# Patient Record
Sex: Female | Born: 1955 | Race: White | Hispanic: No | Marital: Married | Smoking: Former smoker
Health system: Southern US, Community
[De-identification: ages and names within clinical notes are randomized; demographics above are authoritative.]

## PROBLEM LIST (undated history)

## (undated) DIAGNOSIS — K219 Gastro-esophageal reflux disease without esophagitis: Secondary | ICD-10-CM

## (undated) DIAGNOSIS — Z780 Asymptomatic menopausal state: Secondary | ICD-10-CM

## (undated) DIAGNOSIS — E785 Hyperlipidemia, unspecified: Secondary | ICD-10-CM

## (undated) DIAGNOSIS — J449 Chronic obstructive pulmonary disease, unspecified: Secondary | ICD-10-CM

## (undated) DIAGNOSIS — R430 Anosmia: Secondary | ICD-10-CM

## (undated) DIAGNOSIS — F419 Anxiety disorder, unspecified: Secondary | ICD-10-CM

## (undated) DIAGNOSIS — R011 Cardiac murmur, unspecified: Secondary | ICD-10-CM

## (undated) DIAGNOSIS — M1990 Unspecified osteoarthritis, unspecified site: Secondary | ICD-10-CM

## (undated) DIAGNOSIS — J439 Emphysema, unspecified: Secondary | ICD-10-CM

## (undated) DIAGNOSIS — I509 Heart failure, unspecified: Secondary | ICD-10-CM

## (undated) DIAGNOSIS — M810 Age-related osteoporosis without current pathological fracture: Secondary | ICD-10-CM

## (undated) DIAGNOSIS — I4891 Unspecified atrial fibrillation: Secondary | ICD-10-CM

## (undated) DIAGNOSIS — I050 Rheumatic mitral stenosis: Secondary | ICD-10-CM

## (undated) HISTORY — PX: TUBAL LIGATION: SHX77

## (undated) HISTORY — DX: Hyperlipidemia, unspecified: E78.5

## (undated) HISTORY — PX: CARDIAC VALVE REPLACEMENT: SHX585

## (undated) HISTORY — DX: Gastro-esophageal reflux disease without esophagitis: K21.9

## (undated) HISTORY — DX: Anxiety disorder, unspecified: F41.9

## (undated) HISTORY — DX: Chronic obstructive pulmonary disease, unspecified: J44.9

## (undated) HISTORY — DX: Anosmia: R43.0

## (undated) HISTORY — DX: Unspecified atrial fibrillation: I48.91

## (undated) HISTORY — DX: Age-related osteoporosis without current pathological fracture: M81.0

## (undated) HISTORY — DX: Cardiac murmur, unspecified: R01.1

## (undated) HISTORY — DX: Heart failure, unspecified: I50.9

## (undated) HISTORY — DX: Unspecified osteoarthritis, unspecified site: M19.90

## (undated) HISTORY — DX: Emphysema, unspecified: J43.9

## (undated) HISTORY — DX: Asymptomatic menopausal state: Z78.0

## (undated) HISTORY — DX: Rheumatic mitral stenosis: I05.0

---

## 1990-02-19 HISTORY — PX: OTHER SURGICAL HISTORY: SHX169

## 2004-02-20 HISTORY — PX: OTHER SURGICAL HISTORY: SHX169

## 2005-01-19 HISTORY — PX: MITRAL VALVULOPLASTY: SHX143

## 2006-02-19 HISTORY — PX: OTHER SURGICAL HISTORY: SHX169

## 2006-08-20 ENCOUNTER — Encounter: Admission: RE | Admit: 2006-08-20 | Discharge: 2006-08-20 | Payer: Self-pay | Admitting: Obstetrics and Gynecology

## 2006-09-22 ENCOUNTER — Encounter: Payer: Self-pay | Admitting: Family Medicine

## 2006-09-22 LAB — CONVERTED CEMR LAB
HDL: 74 mg/dL
LDL Cholesterol: 127 mg/dL

## 2007-02-20 DIAGNOSIS — Z780 Asymptomatic menopausal state: Secondary | ICD-10-CM

## 2007-02-20 HISTORY — DX: Asymptomatic menopausal state: Z78.0

## 2007-08-05 ENCOUNTER — Ambulatory Visit: Payer: Self-pay | Admitting: Family Medicine

## 2007-08-05 DIAGNOSIS — I4891 Unspecified atrial fibrillation: Secondary | ICD-10-CM

## 2007-08-19 ENCOUNTER — Ambulatory Visit: Payer: Self-pay | Admitting: Family Medicine

## 2007-08-19 LAB — CONVERTED CEMR LAB
INR: 1.6
Prothrombin Time: 15.5 s

## 2007-08-25 ENCOUNTER — Encounter: Admission: RE | Admit: 2007-08-25 | Discharge: 2007-08-25 | Payer: Self-pay | Admitting: Family Medicine

## 2007-08-28 ENCOUNTER — Encounter: Payer: Self-pay | Admitting: Family Medicine

## 2007-09-02 ENCOUNTER — Ambulatory Visit: Payer: Self-pay | Admitting: Family Medicine

## 2007-09-02 DIAGNOSIS — R439 Unspecified disturbances of smell and taste: Secondary | ICD-10-CM

## 2007-09-29 ENCOUNTER — Ambulatory Visit: Payer: Self-pay | Admitting: Family Medicine

## 2007-09-29 LAB — CONVERTED CEMR LAB: INR: 1.4

## 2007-10-07 ENCOUNTER — Ambulatory Visit: Payer: Self-pay | Admitting: Family Medicine

## 2007-10-21 ENCOUNTER — Ambulatory Visit: Payer: Self-pay | Admitting: Family Medicine

## 2007-10-21 LAB — CONVERTED CEMR LAB: INR: 2

## 2007-11-04 ENCOUNTER — Other Ambulatory Visit: Admission: RE | Admit: 2007-11-04 | Discharge: 2007-11-04 | Payer: Self-pay | Admitting: Family Medicine

## 2007-11-04 ENCOUNTER — Encounter: Payer: Self-pay | Admitting: Family Medicine

## 2007-11-04 ENCOUNTER — Ambulatory Visit: Payer: Self-pay | Admitting: Family Medicine

## 2007-11-04 LAB — CONVERTED CEMR LAB: Prothrombin Time: 17.5 s

## 2007-11-05 LAB — CONVERTED CEMR LAB
AST: 20 units/L (ref 0–37)
Albumin: 4.4 g/dL (ref 3.5–5.2)
Alkaline Phosphatase: 52 units/L (ref 39–117)
Cholesterol, target level: 200 mg/dL
HDL goal, serum: 40 mg/dL
LDL Cholesterol: 139 mg/dL — ABNORMAL HIGH (ref 0–99)
LDL Goal: 160 mg/dL
Potassium: 4.3 meq/L (ref 3.5–5.3)
Sodium: 138 meq/L (ref 135–145)
TSH: 1.093 microintl units/mL (ref 0.350–4.50)
Total Protein: 7.9 g/dL (ref 6.0–8.3)

## 2007-11-12 ENCOUNTER — Encounter: Payer: Self-pay | Admitting: Family Medicine

## 2007-11-18 ENCOUNTER — Ambulatory Visit: Payer: Self-pay | Admitting: Family Medicine

## 2007-12-10 ENCOUNTER — Ambulatory Visit: Payer: Self-pay | Admitting: Family Medicine

## 2008-01-07 ENCOUNTER — Ambulatory Visit: Payer: Self-pay | Admitting: Family Medicine

## 2008-02-04 ENCOUNTER — Ambulatory Visit: Payer: Self-pay | Admitting: Family Medicine

## 2008-02-04 LAB — CONVERTED CEMR LAB
INR: 2
Prothrombin Time: 17.3 s

## 2008-03-03 ENCOUNTER — Ambulatory Visit: Payer: Self-pay | Admitting: Family Medicine

## 2008-03-03 LAB — CONVERTED CEMR LAB: Prothrombin Time: 16.4 s

## 2008-03-10 ENCOUNTER — Ambulatory Visit: Payer: Self-pay | Admitting: Cardiology

## 2008-03-22 ENCOUNTER — Encounter: Payer: Self-pay | Admitting: Cardiology

## 2008-03-22 ENCOUNTER — Ambulatory Visit: Payer: Self-pay

## 2008-03-24 ENCOUNTER — Ambulatory Visit: Payer: Self-pay | Admitting: Family Medicine

## 2008-03-24 LAB — CONVERTED CEMR LAB: INR: 2.2

## 2008-04-21 ENCOUNTER — Encounter: Payer: Self-pay | Admitting: Family Medicine

## 2008-04-21 LAB — CONVERTED CEMR LAB: INR: 2.4

## 2008-05-09 ENCOUNTER — Ambulatory Visit: Payer: Self-pay | Admitting: Family Medicine

## 2008-05-09 DIAGNOSIS — J309 Allergic rhinitis, unspecified: Secondary | ICD-10-CM | POA: Insufficient documentation

## 2008-05-10 ENCOUNTER — Telehealth: Payer: Self-pay | Admitting: Family Medicine

## 2008-05-24 ENCOUNTER — Encounter: Payer: Self-pay | Admitting: Family Medicine

## 2008-05-24 LAB — CONVERTED CEMR LAB
INR: 2.5
INR: 2.5 — ABNORMAL HIGH (ref 0.0–1.5)

## 2008-06-21 ENCOUNTER — Encounter: Payer: Self-pay | Admitting: Family Medicine

## 2008-06-21 LAB — CONVERTED CEMR LAB
INR: 2.9
INR: 2.9 — ABNORMAL HIGH (ref 0.0–1.5)

## 2008-07-20 ENCOUNTER — Encounter: Payer: Self-pay | Admitting: Family Medicine

## 2008-08-11 ENCOUNTER — Encounter (INDEPENDENT_AMBULATORY_CARE_PROVIDER_SITE_OTHER): Payer: Self-pay | Admitting: *Deleted

## 2008-08-17 ENCOUNTER — Encounter: Payer: Self-pay | Admitting: Family Medicine

## 2008-08-17 LAB — CONVERTED CEMR LAB
INR: 1.7
INR: 1.7 — ABNORMAL HIGH (ref 0.0–1.5)
Prothrombin Time: 16.5 s
Prothrombin Time: 16.5 s — ABNORMAL HIGH (ref 10.2–14.0)

## 2008-08-21 DIAGNOSIS — I050 Rheumatic mitral stenosis: Secondary | ICD-10-CM | POA: Insufficient documentation

## 2008-08-21 DIAGNOSIS — E785 Hyperlipidemia, unspecified: Secondary | ICD-10-CM | POA: Insufficient documentation

## 2008-09-01 ENCOUNTER — Ambulatory Visit: Payer: Self-pay | Admitting: Cardiology

## 2008-09-01 ENCOUNTER — Encounter: Payer: Self-pay | Admitting: Cardiology

## 2008-09-07 ENCOUNTER — Encounter: Admission: RE | Admit: 2008-09-07 | Discharge: 2008-09-07 | Payer: Self-pay | Admitting: Family Medicine

## 2008-09-14 ENCOUNTER — Encounter: Payer: Self-pay | Admitting: Family Medicine

## 2008-09-14 ENCOUNTER — Telehealth (INDEPENDENT_AMBULATORY_CARE_PROVIDER_SITE_OTHER): Payer: Self-pay | Admitting: *Deleted

## 2008-09-14 LAB — CONVERTED CEMR LAB
Prothrombin Time: 22.8 s
Prothrombin Time: 22.8 s — ABNORMAL HIGH (ref 10.2–14.0)

## 2008-10-04 ENCOUNTER — Ambulatory Visit: Payer: Self-pay | Admitting: Family Medicine

## 2008-10-04 LAB — CONVERTED CEMR LAB
Nitrite: NEGATIVE
Protein, U semiquant: NEGATIVE
Specific Gravity, Urine: 1.01
Urobilinogen, UA: 0.2

## 2008-10-15 ENCOUNTER — Encounter: Payer: Self-pay | Admitting: Family Medicine

## 2008-10-15 LAB — CONVERTED CEMR LAB
INR: 2.3
Prothrombin Time: 20.3 s

## 2008-11-15 ENCOUNTER — Encounter: Payer: Self-pay | Admitting: Family Medicine

## 2008-11-15 LAB — CONVERTED CEMR LAB
INR: 2.8
INR: 2.8 — ABNORMAL HIGH (ref 0.0–1.5)
Prothrombin Time: 22.5 s — ABNORMAL HIGH (ref 10.2–14.0)

## 2008-12-06 ENCOUNTER — Ambulatory Visit: Payer: Self-pay | Admitting: Family Medicine

## 2008-12-06 DIAGNOSIS — Z9889 Other specified postprocedural states: Secondary | ICD-10-CM | POA: Insufficient documentation

## 2008-12-13 ENCOUNTER — Encounter: Payer: Self-pay | Admitting: Family Medicine

## 2008-12-14 LAB — CONVERTED CEMR LAB
ALT: 15 units/L (ref 0–35)
AST: 20 units/L (ref 0–37)
BUN: 13 mg/dL (ref 6–23)
Creatinine, Ser: 0.63 mg/dL (ref 0.40–1.20)
HDL: 59 mg/dL (ref >39)
INR: 2.64
INR: 2.64 — ABNORMAL HIGH (ref <1.50)
MCHC: 33.5 g/dL (ref 30.0–36.0)
Platelets: 258 10*3/uL (ref 150–400)
Prothrombin Time: 28 s
Prothrombin Time: 28 s — ABNORMAL HIGH (ref 11.6–15.2)
RDW: 14.4 % (ref 11.5–15.5)
Total Bilirubin: 0.4 mg/dL (ref 0.3–1.2)
Total CHOL/HDL Ratio: 3.7
VLDL: 16 mg/dL (ref 0–40)

## 2008-12-16 ENCOUNTER — Telehealth: Payer: Self-pay | Admitting: Cardiology

## 2009-01-10 ENCOUNTER — Encounter: Payer: Self-pay | Admitting: Family Medicine

## 2009-01-10 LAB — CONVERTED CEMR LAB: Prothrombin Time: 22.6 s

## 2009-01-27 ENCOUNTER — Telehealth (INDEPENDENT_AMBULATORY_CARE_PROVIDER_SITE_OTHER): Payer: Self-pay | Admitting: *Deleted

## 2009-02-07 ENCOUNTER — Encounter: Payer: Self-pay | Admitting: Family Medicine

## 2009-02-08 LAB — CONVERTED CEMR LAB
INR: 2.93
INR: 2.93 — ABNORMAL HIGH (ref <1.50)
Prothrombin Time: 23.4 s

## 2009-02-17 ENCOUNTER — Telehealth: Payer: Self-pay | Admitting: Family Medicine

## 2009-02-17 ENCOUNTER — Encounter (INDEPENDENT_AMBULATORY_CARE_PROVIDER_SITE_OTHER): Payer: Self-pay | Admitting: *Deleted

## 2009-02-17 DIAGNOSIS — R040 Epistaxis: Secondary | ICD-10-CM

## 2009-02-25 ENCOUNTER — Ambulatory Visit: Payer: Self-pay | Admitting: Family Medicine

## 2009-02-25 DIAGNOSIS — S9000XA Contusion of unspecified ankle, initial encounter: Secondary | ICD-10-CM | POA: Insufficient documentation

## 2009-02-25 LAB — CONVERTED CEMR LAB: INR: 3.6

## 2009-02-28 ENCOUNTER — Telehealth: Payer: Self-pay | Admitting: Family Medicine

## 2009-02-28 ENCOUNTER — Encounter: Payer: Self-pay | Admitting: Family Medicine

## 2009-02-28 DIAGNOSIS — I808 Phlebitis and thrombophlebitis of other sites: Secondary | ICD-10-CM | POA: Insufficient documentation

## 2009-03-07 ENCOUNTER — Telehealth: Payer: Self-pay | Admitting: Family Medicine

## 2009-03-11 ENCOUNTER — Encounter: Payer: Self-pay | Admitting: Family Medicine

## 2009-03-11 LAB — CONVERTED CEMR LAB
Prothrombin Time: 19.3 s
Prothrombin Time: 19.3 s — ABNORMAL HIGH (ref 10.2–14.0)

## 2009-03-14 LAB — CONVERTED CEMR LAB
AST: 19 units/L (ref 0–37)
Direct LDL: 85 mg/dL

## 2009-04-01 ENCOUNTER — Telehealth: Payer: Self-pay | Admitting: Cardiology

## 2009-04-11 ENCOUNTER — Encounter: Payer: Self-pay | Admitting: Family Medicine

## 2009-04-11 LAB — CONVERTED CEMR LAB
INR: 2.07 — ABNORMAL HIGH (ref <1.50)
Prothrombin Time: 18.9 s — ABNORMAL HIGH (ref 10.2–14.0)

## 2009-04-18 ENCOUNTER — Telehealth: Payer: Self-pay | Admitting: Family Medicine

## 2009-05-09 ENCOUNTER — Encounter: Payer: Self-pay | Admitting: Family Medicine

## 2009-05-09 LAB — CONVERTED CEMR LAB
Prothrombin Time: 20.2 s
Prothrombin Time: 20.2 s — ABNORMAL HIGH (ref 10.2–14.0)

## 2009-05-16 ENCOUNTER — Ambulatory Visit: Payer: Self-pay | Admitting: Family Medicine

## 2009-06-06 ENCOUNTER — Encounter: Payer: Self-pay | Admitting: Family Medicine

## 2009-06-06 LAB — CONVERTED CEMR LAB
INR: 2.01
INR: 2.01 — ABNORMAL HIGH (ref <1.50)
Prothrombin Time: 18.6 s
Prothrombin Time: 18.6 s — ABNORMAL HIGH (ref 10.2–14.0)

## 2009-06-07 ENCOUNTER — Encounter: Payer: Self-pay | Admitting: Family Medicine

## 2009-06-29 ENCOUNTER — Ambulatory Visit: Payer: Self-pay | Admitting: Cardiology

## 2009-06-29 ENCOUNTER — Encounter: Payer: Self-pay | Admitting: Cardiology

## 2009-07-04 ENCOUNTER — Encounter: Payer: Self-pay | Admitting: Family Medicine

## 2009-07-04 LAB — CONVERTED CEMR LAB
Prothrombin Time: 17.9 s
Prothrombin Time: 17.9 s — ABNORMAL HIGH (ref 10.2–14.0)

## 2009-08-01 ENCOUNTER — Ambulatory Visit (HOSPITAL_COMMUNITY): Admission: RE | Admit: 2009-08-01 | Discharge: 2009-08-01 | Payer: Self-pay | Admitting: Cardiology

## 2009-08-01 ENCOUNTER — Ambulatory Visit: Payer: Self-pay | Admitting: Cardiovascular Disease

## 2009-08-01 ENCOUNTER — Encounter: Payer: Self-pay | Admitting: Family Medicine

## 2009-08-01 ENCOUNTER — Ambulatory Visit: Payer: Self-pay

## 2009-08-01 ENCOUNTER — Encounter: Payer: Self-pay | Admitting: Cardiology

## 2009-08-29 ENCOUNTER — Encounter: Payer: Self-pay | Admitting: Family Medicine

## 2009-08-29 LAB — CONVERTED CEMR LAB
INR: 2.05
INR: 2.05 — ABNORMAL HIGH (ref <1.50)
Prothrombin Time: 22.7 s — ABNORMAL HIGH (ref 11.6–15.2)

## 2009-09-26 ENCOUNTER — Encounter: Payer: Self-pay | Admitting: Family Medicine

## 2009-09-26 LAB — CONVERTED CEMR LAB
INR: 2.28
INR: 2.28 — ABNORMAL HIGH (ref <1.50)
Prothrombin Time: 24.7 s — ABNORMAL HIGH (ref 11.6–15.2)

## 2009-09-27 ENCOUNTER — Encounter: Admission: RE | Admit: 2009-09-27 | Discharge: 2009-09-27 | Payer: Self-pay | Admitting: Family Medicine

## 2009-10-31 ENCOUNTER — Telehealth (INDEPENDENT_AMBULATORY_CARE_PROVIDER_SITE_OTHER): Payer: Self-pay | Admitting: *Deleted

## 2009-10-31 ENCOUNTER — Encounter: Payer: Self-pay | Admitting: Family Medicine

## 2009-10-31 ENCOUNTER — Encounter: Payer: Self-pay | Admitting: *Deleted

## 2009-11-28 ENCOUNTER — Encounter: Payer: Self-pay | Admitting: Family Medicine

## 2009-11-28 LAB — CONVERTED CEMR LAB: Prothrombin Time: 23 s — ABNORMAL HIGH (ref 11.6–15.2)

## 2009-12-05 ENCOUNTER — Ambulatory Visit: Payer: Self-pay | Admitting: Family Medicine

## 2009-12-22 ENCOUNTER — Telehealth: Payer: Self-pay | Admitting: Family Medicine

## 2009-12-22 DIAGNOSIS — E559 Vitamin D deficiency, unspecified: Secondary | ICD-10-CM | POA: Insufficient documentation

## 2009-12-26 ENCOUNTER — Ambulatory Visit: Payer: Self-pay | Admitting: Family Medicine

## 2009-12-26 ENCOUNTER — Other Ambulatory Visit: Admission: RE | Admit: 2009-12-26 | Discharge: 2009-12-26 | Payer: Self-pay | Admitting: Family Medicine

## 2009-12-26 DIAGNOSIS — Z780 Asymptomatic menopausal state: Secondary | ICD-10-CM | POA: Insufficient documentation

## 2009-12-26 LAB — CONVERTED CEMR LAB
INR: 2.47
INR: 2.47 — ABNORMAL HIGH (ref <1.50)
Prothrombin Time: 26.2 s — ABNORMAL HIGH (ref 11.6–15.2)

## 2009-12-27 LAB — CONVERTED CEMR LAB
Albumin: 4.6 g/dL (ref 3.5–5.2)
Alkaline Phosphatase: 50 units/L (ref 39–117)
BUN: 15 mg/dL (ref 6–23)
Calcium: 9 mg/dL (ref 8.4–10.5)
Chloride: 104 meq/L (ref 96–112)
Cholesterol: 182 mg/dL (ref 0–200)
Glucose, Bld: 96 mg/dL (ref 70–99)
HDL: 66 mg/dL (ref >39)
Hemoglobin: 13.1 g/dL (ref 12.0–15.0)
MCHC: 33.2 g/dL (ref 30.0–36.0)
Potassium: 4.3 meq/L (ref 3.5–5.3)
RBC: 4.77 M/uL (ref 3.87–5.11)
Triglycerides: 42 mg/dL (ref <150)

## 2010-01-03 ENCOUNTER — Encounter: Admission: RE | Admit: 2010-01-03 | Discharge: 2010-01-03 | Payer: Self-pay | Admitting: Family Medicine

## 2010-01-07 DIAGNOSIS — M899 Disorder of bone, unspecified: Secondary | ICD-10-CM | POA: Insufficient documentation

## 2010-01-07 DIAGNOSIS — M949 Disorder of cartilage, unspecified: Secondary | ICD-10-CM

## 2010-01-23 ENCOUNTER — Ambulatory Visit: Payer: Self-pay | Admitting: Family Medicine

## 2010-01-23 DIAGNOSIS — M79609 Pain in unspecified limb: Secondary | ICD-10-CM

## 2010-01-23 LAB — CONVERTED CEMR LAB
INR: 2.29 — ABNORMAL HIGH (ref <1.50)
Prothrombin Time: 24.8 s — ABNORMAL HIGH (ref 11.6–15.2)

## 2010-02-21 ENCOUNTER — Encounter: Payer: Self-pay | Admitting: Family Medicine

## 2010-02-21 LAB — CONVERTED CEMR LAB
INR: 2.08 — ABNORMAL HIGH (ref <1.50)
Prothrombin Time: 23 s — ABNORMAL HIGH (ref 11.6–15.2)

## 2010-03-21 NOTE — Assessment & Plan Note (Signed)
Summary: Rt foot problem-jr   Vital Signs:  Patient profile:   55 year old female Height:      65 inches Weight:      139 pounds BMI:     23.21 O2 Sat:      99 % on Room air Temp:     99.0 degrees F oral Pulse rate:   83 / minute BP sitting:   113 / 68  (left arm) Cuff size:   regular  Vitals Entered By: Payton Spark CMA (January 23, 2010 3:58 PM)  O2 Flow:  Room air CC: Sore spot on bottom of R foot x 1 month   Primary Care Provider:  Seymour Bars DO  CC:  Sore spot on bottom of R foot x 1 month.  History of Present Illness: 55 yo WF presents for a nodule on the plantar surface of her R foot that she noticed a month ago.  it is not painful to bear weight or ambulate but it does hurt to push on it.  Denies trauma or overuse.  She is able to dorsiflex her big toe w/o pain.  Denies redness.  It is sitting in the medial arch of her foot.    Allergies: No Known Drug Allergies  Past History:  Past Medical History: Reviewed history from 06/29/2009 and no changes required. ATRIAL FIBRILLATION (ICD-427.31) HYPERLIPIDEMIA (ICD-272.4) MITRAL STENOSIS (ICD-394.0) ALLERGIC RHINITIS CAUSE UNSPECIFIED (ICD-477.9) ANOSMIA (ICD-781.1) COUMADIN THERAPY (ICD-V58.61) G1P1 menopause 2009  Past Surgical History: Reviewed history from 06/29/2009 and no changes required. dental implant 2008 Mitral valvuloplasty  12-06 at [facility] BTL 1982  tubal ligation. cath - no CAD 2006  Social History: Reviewed history from 12/06/2008 and no changes required. Doing warehouse work for a store. Married to [place]. Has a grown son close by. Quit smoking 1988. Denies ETOH. Walks some for exercise.  Review of Systems      See HPI  Physical Exam  General:  alert, well-developed, well-nourished, and well-hydrated.   Extremities:  pea - sized R foot medial 1st digit flexor tendon nodule, hard and slightly tender.  full dorsi/plantar flexion of the R big toe w/o pain.  gait normal.   Neurologic:   gait normal.   Skin:  no skin redness or bruising over medial R foot   Impression & Recommendations:  Problem # 1:  FOOT PAIN (ICD-729.5) Localized tenderness only to palpation of a flexor tendon nodule over R 1st metatarsal.  Since it is in the medial arch, it is not bearing weight and is not causing any pain.  Advised pt to just watch this for increasing size and pain.    Complete Medication List: 1)  Metoprolol Succinate 50 Mg Xr24h-tab (Metoprolol succinate) .... Take one tablet by  mouth daily 2)  Pravastatin Sodium 40 Mg Tabs (Pravastatin sodium) .Marland Kitchen.. 1 tab by mouth qhs 3)  Calcium Carbonate-vitamin D 600-400 Mg-unit Tabs (Calcium carbonate-vitamin d) .... Take 1 tablet by mouth once a day 4)  Coumadin 4 Mg Tabs (Warfarin sodium) .... Sunday - 6 mg, monday - 6 mg, tuesday - 4 mg, wednesday - 6 mg, thursday - 6 mg, friday - 4 mg, saturday - 6 mg 5)  Coumadin 1 Mg Tabs (Warfarin sodium) .... Sunday - 6 mg, monday - 6 mg, tuesday - 4 mg, wednesday - 6 mg, thursday - 6 mg, friday - 4 mg, saturday - 6 mg  Patient Instructions: 1)  No treatment needed for flexor tendon nodule in foot. 2)  Call  if it starts to hurt with weight bearing or walking.   Orders Added: 1)  Est. Patient Level II [24401]

## 2010-03-21 NOTE — Assessment & Plan Note (Signed)
Summary: Sonya Fields   Visit Type:  9 months follow up Primary Provider:  Seymour Bars DO  CC:  Chest pains- Tiredness.  History of Present Illness: Sonya Fields is a pleasant female, who has a history of mitral stenosis and atrial fibrillation.  The patient does have a history of rheumatic fever, but she does not recall this.  In 2006, she was sent to [facility] and underwent balloon mitral valvuloplasty.  She also  has a history of paroxysmal atrial fibrillation diagnosed in April 2009.  Last echocardiogram was performed on March 22, 2008. This showed normal LV function, mild to moderate left atrial enlargement, mild mitral stenosis with a valve area of 1.5 cm, and mild mitral regurgitation. I last saw her in July 2010. Since then she has dyspnea with more extreme activities but not with routine activities. It is relieved with rest. There is no orthopnea, PND, pedal edema or syncope. She has had one bout of atypical fibrillation since I saw her previously. It lasted for 2 hours and resolved after taking an extra Toprol. She has not had bleeding.  Current Medications (verified): 1)  Metoprolol Succinate 50 Mg Xr24h-Tab (Metoprolol Succinate) .... Take One Tablet By  Mouth Daily 2)  Pravastatin Sodium 20 Mg Tabs (Pravastatin Sodium) .Marland Kitchen.. 1 Tab By Mouth Qhs 3)  Warfarin Sodium 6 Mg Tabs (Warfarin Sodium) .... Take 1 Tab By Mouth Once Daily As Directed. 4)  Calcium Carbonate-Vitamin D 600-400 Mg-Unit  Tabs (Calcium Carbonate-Vitamin D) .... Take 1 Tablet By Mouth Once A Day  Allergies (verified): No Known Drug Allergies  Past History:  Past Medical History: ATRIAL FIBRILLATION (ICD-427.31) HYPERLIPIDEMIA (ICD-272.4) MITRAL STENOSIS (ICD-394.0) ALLERGIC RHINITIS CAUSE UNSPECIFIED (ICD-477.9) ANOSMIA (ICD-781.1) COUMADIN THERAPY (ICD-V58.61) G1P1 menopause 2009  Past Surgical History: dental implant 2008 Mitral valvuloplasty  12-06 at [facility] BTL 1982  tubal ligation. cath  - no CAD 2006  Social History: Reviewed history from 12/06/2008 and no changes required. 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 Quit smoking 1988. Denies ETOH. Walks some for exercise.  Review of Systems       Some fatigue but no fevers or chills, productive cough, hemoptysis, dysphasia, odynophagia, melena, hematochezia, dysuria, hematuria, rash, seizure activity, orthopnea, PND, pedal edema, claudication. Remaining systems are negative.   Vital Signs:  Patient profile:   55 year old female Height:      65 inches Weight:      141.75 pounds BMI:     23.67 Pulse rate:   78 / minute Pulse rhythm:   regular Resp:     18 per minute BP sitting:   106 / 66  (right arm) Cuff size:   regular  Vitals Entered By: Vikki Ports (Jun 29, 2009 4:28 PM)  Physical Exam  General:  Well-developed well-nourished in no acute distress.  Skin is warm and dry.  HEENT is normal.  Neck is supple. No thyromegaly.  Chest is clear to auscultation with normal expansion.  Cardiovascular exam is regular rate and rhythm.  Abdominal exam nontender or distended. No masses palpated. Extremities show no edema. neuro grossly intact    EKG  Procedure date:  06/29/2009  Findings:      Sinus rhythm at a rate of 70. Axis normal. RV conduction delay. Nonspecific ST changes.  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 Continued SBE prophylaxis. Repeat echocardiogram. Orders: Echocardiogram (Echo)  Problem #  2:  ATRIAL FIBRILLATION (ICD-427.31) Patient had one episode of atrial fibrillation since last evaluation. It was brief and resolved with an additional Toprol. I will continue with her Toprol as well as her Coumadin. She will need Coumadin lifelong given history of mitral stenosis. If her episodes of atrial fibrillation become more frequent then we will consider an antiarrhythmic  such as but not in the future. She would prefer no additional medications at this time.  The following medications were removed from the medication list:    Coumadin 4 Mg Tabs (Warfarin sodium) ..... Sunday - 6 mg, monday - 6 mg, tuesday - 4 mg, wednesday - 6 mg, thursday - 6 mg, friday - 4 mg, saturday - 6 mg    Coumadin 1 Mg Tabs (Warfarin sodium) ..... Sunday - 6 mg, monday - 6 mg, tuesday - 4 mg, wednesday - 6 mg, thursday - 6 mg, friday - 4 mg, saturday - 6 mg Her updated medication list for this problem includes:    Metoprolol Succinate 50 Mg Xr24h-tab (Metoprolol succinate) .Marland Kitchen... Take one tablet by  mouth daily    Warfarin Sodium 6 Mg Tabs (Warfarin sodium) .Marland Kitchen... Take 1 tab by mouth once daily as directed.  Problem # 3:  HYPERLIPIDEMIA (ICD-272.4) Continue statin. Lipids and liver monitor by primary care. Her updated medication list for this problem includes:    Pravastatin Sodium 20 Mg Tabs (Pravastatin sodium) .Marland Kitchen... 1 tab by mouth qhs  Problem # 4:  MITRAL STENOSIS (ICD-394.0) As per #1.  Problem # 5:  COUMADIN THERAPY (ICD-V58.61) Monitored by her primary care physician.  Patient Instructions: 1)  Your physician recommends that you schedule a follow-up appointment in: ONE YEAR 2)  Your physician has requested that you have an echocardiogram.  Echocardiography is a painless test that uses sound waves to create images of your heart. It provides your doctor with information about the size and shape of your heart and how well your heart's chambers and valves are working.  This procedure takes approximately one hour. There are no restrictions for this procedure.

## 2010-03-21 NOTE — Assessment & Plan Note (Signed)
Summary: bruise/ INR   Vital Signs:  Patient profile:   55 year old female Height:      65 inches Weight:      140 pounds BMI:     23.38 O2 Sat:      99 % on Room air Pulse rate:   81 / minute BP sitting:   115 / 68  (left arm) Cuff size:   regular  Vitals Entered By: Payton Spark CMA (February 25, 2009 10:35 AM)  O2 Flow:  Room air CC: Bruise and swelling on R lower leg and ankle x 1 day   Primary Care Provider:  Seymour Bars DO  CC:  Bruise and swelling on R lower leg and ankle x 1 day.  History of Present Illness: 55 yo WF presents for swelling and brusing of the R ankle following a contusion with a basket yesterday.  The R foot and ankle are swollen and brusied today with some pain.  O/w feels fine.  Anticoagulation Management History:      The patient is on coumadin and comes in today for a routine follow up visit.  Coumadin therapy is being given due to chronic atrial fibrillation.  Plans are to keep the patient on coumadin for life.  Her last INR was 2.93 and today's INR is 3.6.     Current Medications (verified): 1)  Metoprolol Succinate 50 Mg Xr24h-Tab (Metoprolol Succinate) .... Take One Tablet By  Mouth Daily 2)  Pravastatin Sodium 20 Mg Tabs (Pravastatin Sodium) .Marland Kitchen.. 1 Tab By Mouth Qhs 3)  Warfarin Sodium 6 Mg Tabs (Warfarin Sodium) .... Take 1 Tab By Mouth Once Daily As Directed. 4)  Coumadin 5 Mg Tabs (Warfarin Sodium) .... Sunday - 6 Mg, Monday - 6 Mg, Tuesday - 6 Mg, Wednesday - 6 Mg, Thursday - 6 Mg, Friday - 6 Mg, Saturday - 6 Mg 5)  Coumadin 1 Mg Tabs (Warfarin Sodium) .... Sunday - 6 Mg, Monday - 6 Mg, Tuesday - 6 Mg, Wednesday - 6 Mg, Thursday - 6 Mg, Friday - 6 Mg, Saturday - 6 Mg  Allergies (verified): No Known Drug Allergies  Past History:  Past Medical History: Reviewed history from 12/06/2008 and no changes required. Current Problems:  ATRIAL FIBRILLATION (ICD-427.31) HYPERLIPIDEMIA (ICD-272.4) MITRAL STENOSIS (ICD-394.0) ALLERGIC RHINITIS  CAUSE UNSPECIFIED (ICD-477.9) EPISTAXIS (ICD-784.7) NEOPLASM OF UNCERTAIN BEHAVIOR OF SKIN (ICD-238.2) ROUTINE GYNECOLOGICAL EXAMINATION (ICD-V72.31) ANOSMIA (ICD-781.1) COUMADIN THERAPY (ICD-V58.61)  A.Fib Adolph Pollack cards 5-08 G1P1 menopause 2009  Past Surgical History: Reviewed history from 09/01/2008 and no changes required. dental implant 2008 Mitral valvuloplasty  12-06 at [facility] BTL 1982  tubal ligation. cath - no CAD 2006 Echo 2009 - MV mean gradient 2 mm  Social History: Reviewed history from 12/06/2008 and no changes required. 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 Quit smoking 1988. Denies ETOH. Walks some for exercise.  Review of Systems      See HPI  Physical Exam  General:  alert, well-developed, well-nourished, and well-hydrated.   Neck:  no masses.   Lungs:  Normal respiratory effort, chest expands symmetrically. Lungs are clear to auscultation, no crackles or wheezes. Heart:  irreg irreg w/ no M Msk:  limited dorsi and plantarflexion of the R foot/ ankle Pulses:  2+ R pedal pulse Extremities:  bruised, tender, edematous R foot and ankle Neurologic:  sensation intact to light touch.     Impression & Recommendations:  Problem # 1:  BRUISED ANKLE (  ICD-924.21) Ankle contusion yesterday with bleeding and swelling distally secondary to supratherapeutic coumading. Treat with ankle elevation, compression, ice and adjusting down the coumadin. Call if not improved in 3-5 days.    Problem # 2:  COUMADIN THERAPY (ICD-V58.61) Supratherapeutic on current dose of Coumadin at 3.6 with a swollen bruised R foot /ankle. Hold dose x 2 days and back off on dose by 4 mg/ wk.  Recheck in 2 wks. Orders: Fingerstick (64332) Protime INR (95188)  Complete Medication List: 1)  Metoprolol Succinate 50 Mg Xr24h-tab (Metoprolol succinate) .... Take one tablet by  mouth daily 2)  Pravastatin Sodium 20 Mg Tabs (Pravastatin sodium) .Marland Kitchen.. 1  tab by mouth qhs 3)  Warfarin Sodium 6 Mg Tabs (Warfarin sodium) .... Take 1 tab by mouth once daily as directed. 4)  Coumadin 4 Mg Tabs (Warfarin sodium) .... Sunday - 6 mg, monday - 6 mg, tuesday - 4 mg, wednesday - 6 mg, thursday - 6 mg, friday - 4 mg, saturday - 6 mg 5)  Coumadin 1 Mg Tabs (Warfarin sodium) .... Sunday - 6 mg, monday - 6 mg, tuesday - 4 mg, wednesday - 6 mg, thursday - 6 mg, friday - 4 mg, saturday - 6 mg  Anticoagulation Management Assessment/Plan:      The target INR is 2.0-3.0.  She is to have a PT/INR in 4 weeks.  Anticoagulation instructions were given to patient.         Current Anticoagulation Instructions: Hold dose of Coumadin today and tomorrow.  Start new dose on Sunday evening.  REcheck in 2 wks.    Patient Instructions: 1)  Hold Coumading today and tomorrow. 2)  See new dosing and repeat INR in 2 wks. 3)  Elevate R leg, get OTC compression stockings and wear until swelling improves.  Use ice packs for 15 min 3 x a day to improve swelling. 4)  Call if any problems. Prescriptions: COUMADIN 4 MG TABS (WARFARIN SODIUM) Sunday - 6 mg, Monday - 6 mg, Tuesday - 4 mg, Wednesday - 6 mg, Thursday - 6 mg, Friday - 4 mg, Saturday - 6 mg  #30 x 0   Entered and Authorized by:   Irie Fiorello DO   Signed by:   Randa Riss DO on 02/25/2009   Method used:   Electronically to        [facility] [address] (retail)       [address]       [place], [place]  [address]       Ph: [phone]       Fax: [phone]   RxID:   [id number]   Laboratory Results   Blood Tests     PT: 22.9 s   (Normal Range: 10.6-13.4)  INR: 3.6   (Normal Range: 0.88-1.12   Therap INR: 2.0-3.5)

## 2010-03-21 NOTE — Progress Notes (Signed)
Summary: Lab orders  Phone Note Call from Patient Call back at Home Phone [phone]   Caller: Patient Call For: Seymour Bars DO Summary of Call: Pt calls and states needs yearly labs done and is going to hav her PT/INR done on Monday and wanted to know if you could fax the orders to lab so she can get all done on Monday Initial call taken by: Kathlene November LPN,  December 22, 2009 2:51 PM  Follow-up for Phone Call        due for her annual physical.  does she want to do her labs prior to coming back in for her physical? Follow-up by: Seymour Bars DO,  December 22, 2009 7:56 PM  Additional Follow-up for Phone Call Additional follow up Details #1::        Yes. Scheduled her CPE. Need lab orders Additional Follow-up by: Kathlene November LPN,  December 23, 2009 8:00 AM  New Problems: OTH&UNSPEC ENDOCRN NUTRIT METAB&IMMUNITY D/O (ICD-V77.99) UNSPECIFIED VITAMIN D DEFICIENCY (ICD-268.9)   Additional Follow-up for Phone Call Additional follow up Details #2::    done. draw fasting. Follow-up by: Seymour Bars DO,  December 23, 2009 8:13 AM  New Problems: OTH&UNSPEC ENDOCRN NUTRIT METAB&IMMUNITY D/O (ICD-V77.99) UNSPECIFIED VITAMIN D DEFICIENCY (ICD-268.9)

## 2010-03-21 NOTE — Progress Notes (Signed)
Summary: refill  Phone Note Refill Request Message from:  Patient on April 01, 2009 9:03 AM  Refills Requested: Medication #1:  METOPROLOL SUCCINATE 50 MG XR24H-TAB Take one tablet by  mouth daily Send to [phone]  Initial call taken by: Judie Grieve,  April 01, 2009 9:04 AM    Prescriptions: METOPROLOL SUCCINATE 50 MG XR24H-TAB (METOPROLOL SUCCINATE) Take one tablet by  mouth daily  #30 x 12   Entered by:   Kem Parkinson   Authorized by:   Ferman Hamming, MD, [facility]   Signed by:   Kem Parkinson on 04/01/2009   Method used:   Electronically to        [facility] (retail)       [address]       [place], [place]  [address]       Ph: [phone]       Fax: [phone]   RxID:   [id number]

## 2010-03-21 NOTE — Progress Notes (Signed)
Summary: R leg  Phone Note Call from Patient   Caller: Patient Summary of Call: Pt states the knot on her lower right leg is still there. Pt states it does not hurt but wanted to know if it was normal for it to still be there. Please advise.  Initial call taken by: Payton Spark CMA,  April 18, 2009 11:47 AM  Follow-up for Phone Call        it will take a while for her body to reabsorb the hematoma.  Have her set up f/u appt in 4 wks to recheck.   Follow-up by: Seymour Bars DO,  April 18, 2009 11:58 AM     Appended Document: R leg LMOM informing Pt

## 2010-03-21 NOTE — Progress Notes (Signed)
Summary: Cholesterol labs  Phone Note Call from Patient   Caller: Patient Summary of Call: Pt would like f/u cholesterol labs done on Friday bc she will doing PT/INR. Please advise. Initial call taken by: Payton Spark CMA,  March 07, 2009 10:06 AM     Appended Document: Cholesterol labs Labs added

## 2010-03-21 NOTE — Progress Notes (Signed)
Summary: coumadin  Phone Note Call from Patient   Caller: Patient Summary of Call: Dr.Bowen   Call Back  [phone] or  [phone]  FYI- Patient said she havent been taking her coumadin like she should..she been doing 6 five days and 4 two days ..she just want to rely this message to Dr.Bowen. She said she is getting her labs drawn today Initial call taken by: Vanessa [place],  October 31, 2009 9:32 AM  Follow-up for Phone Call        Pt's INR is 2.43 today.  Follow-up by: Payton Spark CMA,  October 31, 2009 11:55 AM    New/Updated Medications: COUMADIN 4 MG TABS (WARFARIN SODIUM) Sunday - 6 mg, Monday - 6 mg, Tuesday - 4 mg, Wednesday - 6 mg, Thursday - 6 mg, Friday - 4 mg, Saturday - 6 mg COUMADIN 6 MG TABS (WARFARIN SODIUM) Sunday - 6 mg, Monday - 6 mg, Tuesday - 4 mg, Wednesday - 6 mg, Thursday - 6 mg, Friday - 4 mg, Saturday - 6 mg   Anticoagulation Management History:      The patient is on coumadin and comes in today for a routine follow up visit.  She is being anticoagulated because of chronic atrial fibrillation.  Plans are to keep the patient on coumadin for life.  Her last INR was 2.28.    Anticoagulation Management Assessment/Plan:      The target INR is 2.0-3.0.  She is to have a PT/INR in 4 weeks.  Anticoagulation instructions were given to patient.         Current Anticoagulation Instructions: The patient is to continue with the same dose of coumadin.  This dosage includes: Coumadin 4 mg tabs and Coumadin 6 mg tabs:  Sunday - 6 mg, Monday - 6 mg, Tuesday - 4 mg, Wednesday - 6 mg, Thursday - 6 mg, Friday - 4 mg, Saturday - 6 mg.  Repeat PT/INR in 4 weeks.      Pt aware of the above.Arvilla Market CMA, Marcelino Duster October 31, 2009 12:58 PM

## 2010-03-21 NOTE — Progress Notes (Signed)
Summary: Neg DVT  Phone Note From Other Clinic   Caller: Nurse Summary of Call: Pt is neg for DVT.   I called Pt to inform her. She is still in pain and has not returned to work. Please advise.  Initial call taken by: Payton Spark CMA,  February 28, 2009 3:19 PM  Follow-up for Phone Call        I spoke to her and she is doing compression hose , elevation and ice.  She just has a hematoma that is still painful.  Since she is on coumadin, she cannot take NSAIDs and tylenol is not helping.  No sign redness or fevers reported.  I sent her over tramadol to use as needed pain.  call if not improved in a wk. U/s neg for DVT, + for hematoma. Follow-up by: Seymour Bars DO,  March 02, 2009 10:04 AM    New/Updated Medications: TRAMADOL HCL 50 MG TABS (TRAMADOL HCL) 1 tab by mouth two times a day as needed pain Prescriptions: TRAMADOL HCL 50 MG TABS (TRAMADOL HCL) 1 tab by mouth two times a day as needed pain  #20 x 0   Entered and Authorized by:   Seymour Bars DO   Signed by:   Seymour Bars DO on 03/02/2009   Method used:   Electronically to        [facility] (retail)       [address]       [place], [place]  [address]       Ph: [phone]       Fax: [phone]   RxID:   [id number]

## 2010-03-21 NOTE — Assessment & Plan Note (Signed)
Summary: Flu shot//mpm  Nurse Visit   Vitals Entered By: Payton Spark CMA (December 05, 2009 8:27 AM)  Allergies: No Known Drug Allergies  Orders Added: 1)  Admin 1st Vaccine [90471] 2)  Flu Vaccine 27yrs + [25366] Flu Vaccine Consent Questions     Do you have a history of severe allergic reactions to this vaccine? no    Any prior history of allergic reactions to egg and/or gelatin? no    Do you have a sensitivity to the preservative Thimersol? no    Do you have a past history of Guillan-Barre Syndrome? no    Do you currently have an acute febrile illness? no    Have you ever had a severe reaction to latex? no    Vaccine information given and explained to patient? yes    Are you currently pregnant? no    Lot Number:[id number]   Exp Date:08/19/2010   Site Given  Left Deltoid IM   .lbflu

## 2010-03-21 NOTE — Consult Note (Signed)
Summary: [facility]  [facility]   Imported By: Lanelle Bal 06/16/2009 10:48:59  _____________________________________________________________________  External Attachment:    Type:   Image     Comment:   External Document

## 2010-03-21 NOTE — Progress Notes (Signed)
Summary: Leg pain worse  Phone Note Call from Patient   Caller: Patient Summary of Call: Pt states her foot is improving but the large knot on lower leg has gotten bigger and more painful. Pt states she can not walk is unable to work. Please advise.  Initial call taken by: Payton Spark CMA,  February 28, 2009 10:41 AM  Follow-up for Phone Call        I will get an LE venous doppler us/ Follow-up by: Seymour Bars DO,  February 28, 2009 11:00 AM  New Problems: PHLEBITIS AND THROMBOPHLEBITIS OF OTHER SITE (ICD-451.89)   New Problems: PHLEBITIS AND THROMBOPHLEBITIS OF OTHER SITE (ICD-451.89)  Appended Document: Leg pain worse Order faxed to [facility] to schedule.

## 2010-03-21 NOTE — Assessment & Plan Note (Signed)
Summary: CPE with pap   Vital Signs:  Patient profile:   55 year old female Height:      65 inches Weight:      136 pounds BMI:     22.71 O2 Sat:      98 % on Room air Pulse rate:   75 / minute BP sitting:   110 / 64  (left arm) Cuff size:   regular  Vitals Entered By: Payton Spark CMA (December 26, 2009 3:32 PM)  O2 Flow:  Room air CC: CPE w/ pap   Primary Care Provider:  Seymour Bars DO  CC:  CPE w/ pap.  History of Present Illness: 55 yo WF presents for CPE with pap smear.  It has been 2 yrs since last pap. Menopausal 3-4 yrs.  Has never had a DEXA. Colonoscopy normal at age 9. Tetanus is UTD.  Flu shot is UTD. Fasting labs done today, will f/u results tomorrow. Doing well on current meds. Denies fam hx of premature heart dz, breast or colon cancer. Monogamous with husband.  Works full time.  Current Medications (verified): 1)  Metoprolol Succinate 50 Mg Xr24h-Tab (Metoprolol Succinate) .... Take One Tablet By  Mouth Daily 2)  Pravastatin Sodium 20 Mg Tabs (Pravastatin Sodium) .Marland Kitchen.. 1 Tab By Mouth Qhs 3)  Calcium Carbonate-Vitamin D 600-400 Mg-Unit  Tabs (Calcium Carbonate-Vitamin D) .... Take 1 Tablet By Mouth Once A Day 4)  Coumadin 4 Mg Tabs (Warfarin Sodium) .... Sunday - 6 Mg, Monday - 6 Mg, Tuesday - 4 Mg, Wednesday - 6 Mg, Thursday - 6 Mg, Friday - 4 Mg, Saturday - 6 Mg 5)  Coumadin 1 Mg Tabs (Warfarin Sodium) .... Sunday - 6 Mg, Monday - 6 Mg, Tuesday - 4 Mg, Wednesday - 6 Mg, Thursday - 6 Mg, Friday - 4 Mg, Saturday - 6 Mg  Allergies (verified): No Known Drug Allergies  Past History:  Past Medical History: Reviewed history from 06/29/2009 and no changes required. ATRIAL FIBRILLATION (ICD-427.31) HYPERLIPIDEMIA (ICD-272.4) MITRAL STENOSIS (ICD-394.0) ALLERGIC RHINITIS CAUSE UNSPECIFIED (ICD-477.9) ANOSMIA (ICD-781.1) COUMADIN THERAPY (ICD-V58.61) G1P1 menopause 2009  Past Surgical History: Reviewed history from 06/29/2009 and no changes  required. dental implant 2008 Mitral valvuloplasty  12-06 at [facility] 1982  tubal ligation. cath - no CAD 2006  Family History: Reviewed history from 11/04/2007 and no changes required. father died at 10, AMI, stroke, high chol, HTN mother died at 16- DM, HTN, high chol brother depression, HTN brother healthy  Social History: Reviewed history from 12/06/2008 and no changes required. Doing warehouse work for a store. Married to [place]. Has a grown son close by. Quit smoking 1988. Denies ETOH. Walks some for exercise.  Review of Systems  The patient denies anorexia, fever, weight loss, weight gain, vision loss, decreased hearing, hoarseness, chest pain, syncope, dyspnea on exertion, peripheral edema, prolonged cough, headaches, hemoptysis, abdominal pain, melena, hematochezia, severe indigestion/heartburn, hematuria, incontinence, genital sores, muscle weakness, suspicious skin lesions, transient blindness, difficulty walking, depression, unusual weight change, abnormal bleeding, enlarged lymph nodes, angioedema, breast masses, and testicular masses.    Physical Exam  General:  alert, well-developed, well-nourished, and well-hydrated.   Head:  normocephalic and atraumatic.   Nose:  no nasal discharge.   Mouth:  good dentition and pharynx pink and moist.   Neck:  no masses.   Lungs:  Normal respiratory effort, chest expands symmetrically. Lungs are clear to auscultation, no crackles or wheezes. Heart:  irreg irreg w/ no M Abdomen:  Bowel sounds  positive,abdomen soft and non-tender without masses, organomegaly, no AA bruits Pulses:  2+ radial and pedal pulses Extremities:  no LE edema Neurologic:  gait normal and DTRs symmetrical and normal.   Skin:  color normal and no suspicious lesions.   Cervical Nodes:  No lymphadenopathy noted Psych:  good eye contact, not anxious appearing, and not depressed appearing.     Impression & Recommendations:  Problem # 1:  ROUTINE  GYNECOLOGICAL EXAMINATION (ICD-V72.31) Keeping healthy checklist for women reviewed. BP at goal.  BMI at goal. Continue current meds.  Has annual cardiology f/u. Updated fastng labs today. Immunizations are UTD. Eating healthy, exercising more.  Add MVI and calcium with D daily. RTC 6 mos.  Complete Medication List: 1)  Metoprolol Succinate 50 Mg Xr24h-tab (Metoprolol succinate) .... Take one tablet by  mouth daily 2)  Pravastatin Sodium 20 Mg Tabs (Pravastatin sodium) .Marland Kitchen.. 1 tab by mouth qhs 3)  Calcium Carbonate-vitamin D 600-400 Mg-unit Tabs (Calcium carbonate-vitamin d) .... Take 1 tablet by mouth once a day 4)  Coumadin 4 Mg Tabs (Warfarin sodium) .... Sunday - 6 mg, monday - 6 mg, tuesday - 4 mg, wednesday - 6 mg, thursday - 6 mg, friday - 4 mg, saturday - 6 mg 5)  Coumadin 1 Mg Tabs (Warfarin sodium) .... Sunday - 6 mg, monday - 6 mg, tuesday - 4 mg, wednesday - 6 mg, thursday - 6 mg, friday - 4 mg, saturday - 6 mg  Other Orders: T-DXA Bone Density/ Appendicular (16109) T-Dual DXA Bone Density/ Axial (60454)  Patient Instructions: 1)  Next office visit for BP/ Cholesterol: 6 months.   Orders Added: 1)  T-DXA Bone Density/ Appendicular [77081] 2)  T-Dual DXA Bone Density/ Axial [77080] 3)  Est. Patient age 42-64 [phone]

## 2010-03-21 NOTE — Assessment & Plan Note (Signed)
Summary: f/u hematoma   Vital Signs:  Patient profile:   55 year old female Height:      65 inches Weight:      142 pounds BMI:     23.72 O2 Sat:      98 % on Room air Pulse rate:   79 / minute BP sitting:   109 / 67  (left arm) Cuff size:   regular  Vitals Entered By: Payton Spark CMA (May 16, 2009 1:53 PM)  O2 Flow:  Room air CC: F/U R foot/leg. Doing much better   Primary Care Provider:  Seymour Bars DO  CC:  F/U R foot/leg. Doing much better.  History of Present Illness: 55 yo WF presents for R ankle hematoma that occured on Jan 7th after a fall off a plastic basket.  Her pain and knot are much improved.  She initially used ice packs and tylenol.  Now off both.  LE venous u/s showed the hematoma.    BP and cholesterol -- doing well on current meds.  INR stable on coumadin.  Still having epistaxis 1-3 x a wk, lasting 5-10 min.  Using Afrin.  Had cauterized in past.  Missed ENT appt.  Needs RF on coumadin today.    Current Medications (verified): 1)  Metoprolol Succinate 50 Mg Xr24h-Tab (Metoprolol Succinate) .... Take One Tablet By  Mouth Daily 2)  Pravastatin Sodium 20 Mg Tabs (Pravastatin Sodium) .Marland Kitchen.. 1 Tab By Mouth Qhs 3)  Warfarin Sodium 6 Mg Tabs (Warfarin Sodium) .... Take 1 Tab By Mouth Once Daily As Directed. 4)  Coumadin 4 Mg Tabs (Warfarin Sodium) .... Sunday - 6 Mg, Monday - 6 Mg, Tuesday - 4 Mg, Wednesday - 6 Mg, Thursday - 6 Mg, Friday - 4 Mg, Saturday - 6 Mg 5)  Coumadin 2 Mg Tabs (Warfarin Sodium) .... Sunday - 6 Mg, Monday - 6 Mg, Tuesday - 4 Mg, Wednesday - 6 Mg, Thursday - 6 Mg, Friday - 4 Mg, Saturday - 6 Mg  Allergies (verified): No Known Drug Allergies  Past History:  Past Medical History: Reviewed history from 12/06/2008 and no changes required. Current Problems:  ATRIAL FIBRILLATION (ICD-427.31) HYPERLIPIDEMIA (ICD-272.4) MITRAL STENOSIS (ICD-394.0) ALLERGIC RHINITIS CAUSE UNSPECIFIED (ICD-477.9) EPISTAXIS (ICD-784.7) NEOPLASM OF UNCERTAIN  BEHAVIOR OF SKIN (ICD-238.2) ROUTINE GYNECOLOGICAL EXAMINATION (ICD-V72.31) ANOSMIA (ICD-781.1) COUMADIN THERAPY (ICD-V58.61)  A.Fib Adolph Pollack cards 5-08 G1P1 menopause 2009  Past Surgical History: Reviewed history from 09/01/2008 and no changes required. dental implant 2008 Mitral valvuloplasty  12-06 at [facility] BTL 1982  tubal ligation. cath - no CAD 2006 Echo 2009 - MV mean gradient 2 mm  Family History: Reviewed history from 11/04/2007 and no changes required. father died at 45, AMI, stroke, high chol, HTN mother died at 38- DM, HTN, high chol brother depression, HTN brother healthy  Social History: Reviewed history from 12/06/2008 and no changes required. 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 Quit smoking 1988. Denies ETOH. Walks some for exercise.  Review of Systems      See HPI  Physical Exam  General:  alert, well-developed, well-nourished, and well-hydrated.   Head:  normocephalic and atraumatic.   Mouth:  pharynx pink and moist.   Extremities:  R anterior distal tibia with quarter sized nontender healing flesh colored hematoma.   Skin:  color normal.     Impression & Recommendations:  Problem # 1:  BRUISED ANKLE (ICD-924.21) Hematoma from Jan improved with a small knot still  present.  No longer having leg edema or pain.  No bruising.    Problem # 2:  EPISTAXIS (ICD-784.7) Will get her back in with ENT.  Complete Medication List: 1)  Metoprolol Succinate 50 Mg Xr24h-tab (Metoprolol succinate) .... Take one tablet by  mouth daily 2)  Pravastatin Sodium 20 Mg Tabs (Pravastatin sodium) .Marland Kitchen.. 1 tab by mouth qhs 3)  Warfarin Sodium 6 Mg Tabs (Warfarin sodium) .... Take 1 tab by mouth once daily as directed. 4)  Coumadin 4 Mg Tabs (Warfarin sodium) .... Sunday - 6 mg, monday - 6 mg, tuesday - 4 mg, wednesday - 6 mg, thursday - 6 mg, friday - 4 mg, saturday - 6 mg 5)  Coumadin 2 Mg Tabs (Warfarin sodium) .... Sunday - 6  mg, monday - 6 mg, tuesday - 4 mg, wednesday - 6 mg, thursday - 6 mg, friday - 4 mg, saturday - 6 mg  Patient Instructions: 1)  Hematoma healing! 2)  Return for f/u in 4 mos. 3)  Will get you back in with Dr Alsup (ENT). Prescriptions: COUMADIN 4 MG TABS (WARFARIN SODIUM) Sunday - 6 mg, Monday - 6 mg, Tuesday - 4 mg, Wednesday - 6 mg, Thursday - 6 mg, Friday - 4 mg, Saturday - 6 mg  #30 x 2   Entered and Authorized by:   Aiya Keach DO   Signed by:   Helena Sardo DO on 05/16/2009   Method used:   Electronically to        [facility] (retail)       [address]       [place], [place]  [address]       Ph: [phone]       Fax: [phone]   RxID:   [id number]

## 2010-03-24 ENCOUNTER — Encounter: Payer: Self-pay | Admitting: Family Medicine

## 2010-03-24 LAB — CONVERTED CEMR LAB
INR: 1.94 — ABNORMAL HIGH (ref <1.50)
Prothrombin Time: 21.8 s — ABNORMAL HIGH (ref 11.6–15.2)

## 2010-03-31 ENCOUNTER — Telehealth: Payer: Self-pay | Admitting: Family Medicine

## 2010-04-03 ENCOUNTER — Encounter: Payer: Self-pay | Admitting: Family Medicine

## 2010-04-04 LAB — CONVERTED CEMR LAB
Cholesterol: 178 mg/dL (ref 0–200)
Triglycerides: 67 mg/dL (ref <150)

## 2010-04-06 NOTE — Progress Notes (Signed)
Summary: Lab order  Phone Note Call from Patient Call back at Home Phone [phone] Call back at Work Phone [phone]   Caller: Patient Summary of Call: needs order for repeat lipids.  Faxed to [facility], pt notified to come to lab fasting. Initial call taken by: Francee Piccolo CMA Duncan Dull),  March 31, 2010 2:23 PM

## 2010-04-13 ENCOUNTER — Telehealth: Payer: Self-pay | Admitting: Cardiology

## 2010-04-18 NOTE — Progress Notes (Signed)
Summary: refill  Phone Note Refill Request Message from:  Patient on April 13, 2010 9:11 AM  Refills Requested: Medication #1:  METOPROLOL SUCCINATE 50 MG XR24H-TAB Take one tablet by  mouth daily [address]  Initial call taken by: Judie Grieve,  April 13, 2010 9:12 AM    Prescriptions: METOPROLOL SUCCINATE 50 MG XR24H-TAB (METOPROLOL SUCCINATE) Take one tablet by  mouth daily  #30 x 12   Entered by:   Kem Parkinson   Authorized by:   Ferman Hamming, MD, [facility]   Signed by:   Kem Parkinson on 04/13/2010   Method used:   Electronically to        [facility] (retail)       [address]       [place], [place]  [address]       Ph: [phone]       Fax: [phone]   RxID:   [id number]

## 2010-04-21 ENCOUNTER — Encounter: Payer: Self-pay | Admitting: Family Medicine

## 2010-04-24 LAB — CONVERTED CEMR LAB
INR: 2.36
INR: 2.36 — ABNORMAL HIGH (ref <1.50)

## 2010-05-19 ENCOUNTER — Other Ambulatory Visit: Payer: Self-pay | Admitting: Family Medicine

## 2010-05-19 ENCOUNTER — Telehealth: Payer: Self-pay | Admitting: Family Medicine

## 2010-05-19 DIAGNOSIS — I4891 Unspecified atrial fibrillation: Secondary | ICD-10-CM

## 2010-05-19 LAB — PROTIME-INR: Prothrombin Time: 26.5 seconds — ABNORMAL HIGH (ref 11.6–15.2)

## 2010-05-19 MED ORDER — WARFARIN SODIUM 4 MG PO TABS: ORAL_TABLET | ORAL | Status: DC

## 2010-05-19 MED ORDER — WARFARIN SODIUM 6 MG PO TABS: ORAL_TABLET | ORAL | Status: DC

## 2010-05-19 NOTE — Telephone Encounter (Signed)
INR at goal.  Stay on coumadin 6 mg everyday except for Fridays- 4 mg. Recheck in 4 wks.

## 2010-05-19 NOTE — Telephone Encounter (Signed)
LMOM informing Pt of the above 

## 2010-05-26 ENCOUNTER — Other Ambulatory Visit: Payer: Self-pay | Admitting: Family Medicine

## 2010-06-16 ENCOUNTER — Telehealth: Payer: Self-pay | Admitting: Family Medicine

## 2010-06-16 ENCOUNTER — Other Ambulatory Visit: Payer: Self-pay | Admitting: Family Medicine

## 2010-06-16 LAB — PROTIME-INR
INR: 2.18 — ABNORMAL HIGH (ref <1.50)
Prothrombin Time: 23.9 seconds — ABNORMAL HIGH (ref 11.6–15.2)

## 2010-06-16 NOTE — Telephone Encounter (Signed)
Pls let pt know that her INR looks perfect on current dose of coumadin.  Continue at current dose and recheck in 4 wks.

## 2010-06-16 NOTE — Telephone Encounter (Signed)
Pt contacted with provider orders and LMOM that PT/INR looked good on current dose of Coumadin, and is to continue that dose and recheck level in 4 weeks.  To call nurse if additional concerns or questions. Jarvis Newcomer, LPN [facility]

## 2010-06-16 NOTE — Telephone Encounter (Signed)
[facility] lab [facility]) called to give stat PT/INR.  PT 23.9, INR-2.18.  Also faxed results for review.  Please advise. Jarvis Newcomer, LPN [facility]

## 2010-07-04 NOTE — Assessment & Plan Note (Signed)
[facility]                            CARDIOLOGY OFFICE NOTE   PRISCILLE, SHADDUCK                     MRN:          [id number]  DATE:03/10/2008                            DOB:          1955-03-29    [name] is a 55 year old female, who has a history of mitral  stenosis and atrial fibrillation, who we are asked to evaluate for that  reason.  The patient does have a history of rheumatic fever, but she  does not recall this.  She has been followed in [facility] for mitral  stenosis.  In 2006, she was sent to [facility] and underwent  balloon mitral valvuloplasty.  She also apparently has a history of  paroxysmal atrial fibrillation diagnosed in April 2009.  I do not have  those records available.  However, she is on Toprol and Coumadin.  She  would prefer to be followed in [place] and we were therefore asked  to further evaluate.  Note, she has dyspnea with more extreme  activities, but not with routine activity.  There is no orthopnea, PND,  pedal edema, presyncope, syncope, or exertional chest pain.  Occasionally, she has brief palpitations.   PRESENT MEDICATIONS:  1. Coumadin as directed.  2. Toprol 50 mg p.o. daily.   ALLERGIES:  She has no known drug allergies.   SOCIAL HISTORY:  She does not smoke nor does she consume alcohol.   FAMILY HISTORY:  Negative for coronary artery disease at an early age.  She did state that she had a parent, who died in their 50s of a  myocardial infarction.   PAST MEDICAL HISTORY:  She has history of mild hyperlipidemia, but there  is no diabetes mellitus or hypertension.  She has a history of mitral  valve stenosis as described in the HPI as well as paroxysmal atrial  fibrillation.  She has had a previous tubal ligation.  She also has  gastroesophageal reflux disease.  She has had no other health problems.   REVIEW OF SYSTEMS:  There is no headaches, fevers, or chills.  There is  no  productive cough or hemoptysis.  There is no dysphagia, odynophagia,  melena, or hematochezia.  There is no dysuria or hematuria.  There is no  rash or seizure activity.  There is no orthopnea, PND, or pedal edema.  Her remaining systems are negative.   PHYSICAL EXAMINATION:  VITAL SIGNS:  Her blood pressure is 121/80 and  the pulse is 79.  GENERAL:  She is well developed, well nourished, in no acute distress.  SKIN:  Warm and dry.  She does not appear to be depressed.  There is no  peripheral clubbing.  BACK:  Normal.  HEENT:  Normal eyelids.  NECK:  Supple with normal upstrokes bilaterally.  No bruits heard.  There is no jugular vein distention and I cannot appreciate thyromegaly.  CHEST:  Clear to auscultation.  There is no expansion.  CARDIOVASCULAR:  Regular rate and rhythm.  Normal S1 and S2.  There is  an S4.  I cannot appreciate diastolic rumble nor cannot appreciate a  systolic murmur.  ABDOMEN:  Nontender, nondistended, positive bowel sounds.  No  hepatosplenomegaly, no mass appreciated, and no bruit.  She has 2+  femoral pulses bilaterally.  No bruits.  EXTREMITIES:  No edema to palpate.  No cords.  She has 2+ dorsalis pedis  pulses bilaterally.  NEUROLOGIC:  Grossly intact.   Her electrocardiogram shows sinus rhythm at a rate of 78.  The axis is  normal.  There are minor nonspecific ST changes.   DIAGNOSES:  1. Mitral stenosis - Ms. Ciresi has a history of mitral stenosis and      has had previous balloon mitral valvuloplasty.  However, she is not      having symptoms at present.  We will plan to repeat her      echocardiogram to reassess her mitral valve gradient and mitral      valve area.  She will also continue subacute bacterial endocarditis      prophylaxis.  2. Paroxysmal atrial fibrillation - the patient remains in sinus      rhythm today.  She will continue on Toprol for rate control if she      develops recurrent arrhythmias.  She will also need lifelong       Coumadin given her history of mitral stenosis and atrial      fibrillation.  I have explained this to her.  3. Coumadin therapy - this is being monitored by Dr. Cathey Endow and the      goal INR will be 2-3.   We will see her back in 6 months.     Madolyn Frieze Jens Som, MD, [facility]  Electronically Signed    BSC/MedQ  DD: 03/10/2008  DT: 03/11/2008  Job #: [id number]   cc:   Seymour Bars, D.O.

## 2010-07-14 ENCOUNTER — Ambulatory Visit: Payer: Self-pay | Admitting: Family Medicine

## 2010-07-14 ENCOUNTER — Other Ambulatory Visit: Payer: Self-pay | Admitting: Family Medicine

## 2010-07-14 LAB — PROTIME-INR: INR: 2.62 — ABNORMAL HIGH (ref <1.50)

## 2010-07-30 ENCOUNTER — Other Ambulatory Visit: Payer: Self-pay | Admitting: Family Medicine

## 2010-08-14 ENCOUNTER — Other Ambulatory Visit: Payer: Self-pay | Admitting: Family Medicine

## 2010-08-14 LAB — PROTIME-INR
INR: 2.64 — ABNORMAL HIGH (ref <1.50)
Prothrombin Time: 27.6 seconds — ABNORMAL HIGH (ref 11.6–15.2)

## 2010-08-16 ENCOUNTER — Telehealth: Payer: Self-pay | Admitting: Family Medicine

## 2010-08-16 NOTE — Telephone Encounter (Signed)
Pt called in and said she had lab for her PT/INR drawn on Monday 08-14-10.  The result is not showing in the system.  Can you advise.  I instructed the pt to continue 07-14-10 regimen until I can confirm her new result and dosing regimen.  Told pt will send note to Dr. Cathey Endow and call her tomorrow.  Plan:  Routed to Dr. Cathey Endow.\ Jarvis Newcomer, LPN [facility]

## 2010-08-16 NOTE — Telephone Encounter (Signed)
Pt called and wanted to know her coumadin regimen.  LMOM with the current dosing regimen from 07-14-10 at which time the level was drawn. Jarvis Newcomer, LPN [facility]

## 2010-08-16 NOTE — Telephone Encounter (Signed)
The results is not in there but it should be.  Pls call the lab to check on the status.  Thanks.

## 2010-08-17 ENCOUNTER — Ambulatory Visit (INDEPENDENT_AMBULATORY_CARE_PROVIDER_SITE_OTHER): Payer: Self-pay | Admitting: Family Medicine

## 2010-08-17 ENCOUNTER — Telehealth: Payer: Self-pay | Admitting: Family Medicine

## 2010-08-17 NOTE — Telephone Encounter (Signed)
Called [facility] downstairs and the PT 27.6 and the INR 2.64.  Pt has standing order downstairs and when asked why we did not get the result they claim it is because pt has standing order with them.  [name] at [facility] to make a note that we need a faxed result sent to [place] with each lab draw for her PT/INR. Jarvis Newcomer, LPN [facility]

## 2010-08-17 NOTE — Telephone Encounter (Signed)
Pls let pt know that her INR was perfect at 2.64.  Continue 4 mg of coumadin on Tues and Fri and 6 mg all other days (no change).  Repeat in 1 month.  Let me know if needs RFs.

## 2010-08-18 ENCOUNTER — Telehealth: Payer: Self-pay | Admitting: Family Medicine

## 2010-08-18 NOTE — Telephone Encounter (Signed)
Pt called and stated she needs to know her current coumadin regimen.  Lab did not originally send the results over into the epic system because pt has a standing order at [facility].  Told the lab they need to fax the result then to our office with each lab draw they results out.  Spoke with [employer] at [facility]. Jarvis Newcomer, LPN [facility]

## 2010-08-18 NOTE — Telephone Encounter (Signed)
I already signed off on her PT/INR. The reason it didn't cross over to Epic is b/c her standing order is an old one from [facility].  They just need to make a note to change her over.

## 2010-08-18 NOTE — Telephone Encounter (Signed)
Pt called and regimen given after it showed in EPIC. Jarvis Newcomer, LPN [facility]

## 2010-09-18 ENCOUNTER — Ambulatory Visit: Payer: Self-pay | Admitting: Family Medicine

## 2010-09-18 ENCOUNTER — Other Ambulatory Visit: Payer: Self-pay | Admitting: Family Medicine

## 2010-09-18 ENCOUNTER — Telehealth: Payer: Self-pay | Admitting: Family Medicine

## 2010-09-18 LAB — PROTIME-INR
INR: 2.44 — ABNORMAL HIGH (ref <1.50)
Prothrombin Time: 25.9 seconds — ABNORMAL HIGH (ref 11.6–15.2)

## 2010-09-18 NOTE — Telephone Encounter (Signed)
Pls let pt know that her INR is 2.44 = at goal.  Stay on current dose of coumadin and repeat in 4 wks.

## 2010-09-18 NOTE — Telephone Encounter (Signed)
Pt aware.

## 2010-10-05 ENCOUNTER — Encounter: Payer: Self-pay | Admitting: Cardiology

## 2010-10-16 ENCOUNTER — Telehealth: Payer: Self-pay | Admitting: Family Medicine

## 2010-10-16 NOTE — Telephone Encounter (Signed)
Pt called and was inquiring as to when she is to repeat her PT/INR.  Last telephone note from Payton Spark, CMA on 09-18-10 was informing the pt repeat PT/INR in 4 weeks.  Told to repeat on or around 10-19-10.  Told to call and schedule nurse visit for 10-19-10 if she normally does lab in our office.  LMOM for the pt informing her of these orders.  Told her to call if she had add'l questions. Jarvis Newcomer, LPN [facility]

## 2010-10-19 ENCOUNTER — Other Ambulatory Visit: Payer: Self-pay | Admitting: Family Medicine

## 2010-10-19 DIAGNOSIS — Z1231 Encounter for screening mammogram for malignant neoplasm of breast: Secondary | ICD-10-CM

## 2010-10-20 ENCOUNTER — Ambulatory Visit: Payer: Self-pay | Admitting: Family Medicine

## 2010-10-20 ENCOUNTER — Telehealth: Payer: Self-pay | Admitting: Family Medicine

## 2010-10-20 NOTE — Telephone Encounter (Signed)
Haven't seen them. Will call the lbs.

## 2010-10-20 NOTE — Telephone Encounter (Signed)
Pt called for results. Doesn't look like we have them. Dr. Linford Arnold have you gotten these back from yesterday- done at lab

## 2010-10-20 NOTE — Telephone Encounter (Signed)
PT WANTS LAB RESULTS OF PT/INR FROM YESTERDAY - PLEASE CALL

## 2010-10-27 ENCOUNTER — Ambulatory Visit (INDEPENDENT_AMBULATORY_CARE_PROVIDER_SITE_OTHER): Payer: Managed Care, Other (non HMO) | Admitting: Family Medicine

## 2010-10-27 ENCOUNTER — Encounter: Payer: Self-pay | Admitting: Family Medicine

## 2010-10-27 ENCOUNTER — Ambulatory Visit: Payer: Self-pay | Admitting: Family Medicine

## 2010-10-27 VITALS — BP 121/73 | HR 77 | Wt 138.0 lb

## 2010-10-27 DIAGNOSIS — M791 Myalgia, unspecified site: Secondary | ICD-10-CM

## 2010-10-27 DIAGNOSIS — I4891 Unspecified atrial fibrillation: Secondary | ICD-10-CM

## 2010-10-27 DIAGNOSIS — Z23 Encounter for immunization: Secondary | ICD-10-CM

## 2010-10-27 DIAGNOSIS — IMO0001 Reserved for inherently not codable concepts without codable children: Secondary | ICD-10-CM

## 2010-10-27 DIAGNOSIS — E785 Hyperlipidemia, unspecified: Secondary | ICD-10-CM

## 2010-10-27 LAB — POCT INR: INR: 2.5

## 2010-10-27 MED ORDER — PITAVASTATIN CALCIUM 1 MG PO TABS: 1.0000 mg | ORAL_TABLET | Freq: Every day | ORAL | Status: DC

## 2010-10-27 NOTE — Patient Instructions (Signed)
Recheck cholesterol in 2 months

## 2010-10-27 NOTE — Progress Notes (Signed)
  Subjective:    Patient ID: Sonya Fields, female    DOB: 11-29-1955, 55 y.o.   MRN: [id number]  HPI  Fatigue and exhausted and muscle pain all over.  Stopped her med about 6 weeks ago. She has only ever been on the pravastatin. She is feeling much better. She felt like she had tendonitis in both arms and it is much better.    Review of Systems     Objective:   Physical Exam  Constitutional: She is oriented to person, place, and time. She appears well-developed and well-nourished.  HENT:  Head: Normocephalic and atraumatic.  Cardiovascular: Normal rate, regular rhythm and normal heart sounds.   Pulmonary/Chest: Effort normal and breath sounds normal.  Neurological: She is alert and oriented to person, place, and time.  Skin: Skin is warm and dry.  Psychiatric: She has a normal mood and affect. Her behavior is normal.          Assessment & Plan:  Myalgias on statin - Discussed the importance of treating her cholesterol. Discussed we can try a different statin.  Will start livalo 1mg .  Given samples and coupon care. Will recheck in  . Call if has any more myalgias as i do want to check a CK while on the medication.  We discussed the importance of healthy diet and exercise.    Afib- see flow sheet for med regimen. She was at goal today.

## 2010-11-07 ENCOUNTER — Inpatient Hospital Stay: Admission: RE | Admit: 2010-11-07 | Payer: Self-pay | Source: Ambulatory Visit

## 2010-11-10 ENCOUNTER — Ambulatory Visit: Payer: Self-pay | Admitting: Family Medicine

## 2010-11-10 ENCOUNTER — Telehealth: Payer: Self-pay | Admitting: Family Medicine

## 2010-11-10 ENCOUNTER — Other Ambulatory Visit: Payer: Self-pay | Admitting: Family Medicine

## 2010-11-10 LAB — PROTIME-INR
INR: 2 — ABNORMAL HIGH (ref <1.50)
Prothrombin Time: 22.1 seconds — ABNORMAL HIGH (ref 11.6–15.2)

## 2010-11-10 NOTE — Progress Notes (Signed)
  Subjective:    Patient ID: Sonya Fields, female    DOB: 12/29/1955, 55 y.o.   MRN: [id number]  HPI LMOM informing Pt    Review of Systems     Objective:   Physical Exam        Assessment & Plan:

## 2010-11-10 NOTE — Telephone Encounter (Signed)
See anticoag note

## 2010-11-10 NOTE — Telephone Encounter (Signed)
[facility] called with STAT lab results on this pt: PT (high) 22.1 INR (high) 2.00 Plan:  Results forwarded to Dr. Marlyne Beards, LPN [facility]

## 2010-11-13 NOTE — Telephone Encounter (Signed)
LMOM informing Pt  

## 2010-11-27 ENCOUNTER — Other Ambulatory Visit: Payer: Self-pay | Admitting: Family Medicine

## 2010-11-27 ENCOUNTER — Ambulatory Visit: Payer: Self-pay | Admitting: Family Medicine

## 2010-11-27 ENCOUNTER — Telehealth: Payer: Self-pay | Admitting: *Deleted

## 2010-11-27 NOTE — Telephone Encounter (Signed)
Message copied by Wyline Beady on Mon Nov 27, 2010  4:50 PM ------      Message from: Nani Gasser D      Created: Mon Nov 27, 2010  4:48 PM       See coumadin flow sheet

## 2010-11-27 NOTE — Telephone Encounter (Signed)
Pt.notified

## 2010-11-28 ENCOUNTER — Ambulatory Visit
Admission: RE | Admit: 2010-11-28 | Discharge: 2010-11-28 | Disposition: A | Discharge status: Home / Self Care | Payer: Managed Care, Other (non HMO) | Source: Ambulatory Visit | Attending: Family Medicine | Admitting: Family Medicine

## 2010-11-28 ENCOUNTER — Telehealth: Payer: Self-pay | Admitting: *Deleted

## 2010-11-28 DIAGNOSIS — Z1231 Encounter for screening mammogram for malignant neoplasm of breast: Secondary | ICD-10-CM

## 2010-11-28 NOTE — Telephone Encounter (Signed)
Pt has already been notified.

## 2010-11-28 NOTE — Telephone Encounter (Signed)
Pt notified yesterday.

## 2010-11-30 ENCOUNTER — Telehealth: Payer: Self-pay | Admitting: *Deleted

## 2010-11-30 NOTE — Telephone Encounter (Signed)
Message copied by Wyline Beady on Thu Nov 30, 2010  2:34 PM ------      Message from: Nani Gasser D      Created: Wed Nov 29, 2010 10:25 PM       Please call pt and let her know an abnormality in the right breast so th eimaging center should call her and schedule further studies to see if benign.

## 2010-11-30 NOTE — Telephone Encounter (Signed)
LMOM

## 2010-12-05 ENCOUNTER — Other Ambulatory Visit: Payer: Self-pay | Admitting: Family Medicine

## 2010-12-05 DIAGNOSIS — R928 Other abnormal and inconclusive findings on diagnostic imaging of breast: Secondary | ICD-10-CM

## 2010-12-06 NOTE — Telephone Encounter (Signed)
Acknowledged. Makaylin Carlo, LPN /Triage  

## 2010-12-14 ENCOUNTER — Ambulatory Visit
Admission: RE | Admit: 2010-12-14 | Discharge: 2010-12-14 | Disposition: A | Discharge status: Home / Self Care | Payer: Managed Care, Other (non HMO) | Source: Ambulatory Visit | Attending: Family Medicine | Admitting: Family Medicine

## 2010-12-14 DIAGNOSIS — R928 Other abnormal and inconclusive findings on diagnostic imaging of breast: Secondary | ICD-10-CM

## 2010-12-18 ENCOUNTER — Other Ambulatory Visit: Payer: Self-pay | Admitting: Family Medicine

## 2010-12-18 ENCOUNTER — Ambulatory Visit (INDEPENDENT_AMBULATORY_CARE_PROVIDER_SITE_OTHER): Payer: Managed Care, Other (non HMO) | Admitting: Family Medicine

## 2010-12-18 LAB — PROTIME-INR: INR: 1.94 — ABNORMAL HIGH (ref <1.50)

## 2010-12-21 ENCOUNTER — Telehealth: Payer: Self-pay | Admitting: Family Medicine

## 2010-12-21 MED ORDER — WARFARIN SODIUM 1 MG PO TABS: 1.0000 mg | ORAL_TABLET | ORAL | Status: DC

## 2010-12-21 NOTE — Telephone Encounter (Signed)
Pt called and she had PT/INR done 12-18-10 and has not heard about her coumadin regimen.  Plan:  Found regimen ordered from 12-18-10 and pt notified to discuss new dosing regimen.  Pt instructed to recheck level in 2 weeks.  Pt stated she does not have warfarin 1 mg tabs.  Filled script # 60/1 refill to [facility]. Jarvis Newcomer, LPN [facility]

## 2011-01-15 ENCOUNTER — Ambulatory Visit: Payer: Self-pay | Admitting: Family Medicine

## 2011-01-15 ENCOUNTER — Other Ambulatory Visit: Payer: Self-pay | Admitting: Family Medicine

## 2011-01-31 ENCOUNTER — Ambulatory Visit: Payer: Self-pay | Admitting: Family Medicine

## 2011-01-31 ENCOUNTER — Other Ambulatory Visit: Payer: Self-pay | Admitting: Family Medicine

## 2011-01-31 LAB — PROTIME-INR: INR: 2.45 — ABNORMAL HIGH (ref <1.50)

## 2011-02-21 ENCOUNTER — Telehealth: Payer: Self-pay | Admitting: *Deleted

## 2011-02-21 MED ORDER — ACYCLOVIR 400 MG PO TABS: 400.0000 mg | ORAL_TABLET | Freq: Three times a day (TID) | ORAL | Status: DC

## 2011-02-21 NOTE — Telephone Encounter (Signed)
Prescription sent

## 2011-02-21 NOTE — Telephone Encounter (Signed)
Pt would like a refill on the Zovirax she uses for fever blisters. Has had in the past but not on med list. Pt has flu as well

## 2011-02-27 ENCOUNTER — Other Ambulatory Visit: Payer: Self-pay | Admitting: Family Medicine

## 2011-02-27 ENCOUNTER — Ambulatory Visit: Payer: Self-pay | Admitting: Family Medicine

## 2011-02-27 LAB — PROTIME-INR
INR: 3.08 — ABNORMAL HIGH (ref <1.50)
INR: 3.1 — AB (ref 0.9–1.1)
Prothrombin Time: 31.1 seconds — ABNORMAL HIGH (ref 11.6–15.2)

## 2011-02-28 ENCOUNTER — Telehealth: Payer: Self-pay | Admitting: *Deleted

## 2011-02-28 NOTE — Telephone Encounter (Signed)
Pt was notified about coumadin intructions and that if she is not able to come in the office for a nurse visit then she needs to have cardiologist manage her coumadin.pt voiced understanding

## 2011-03-06 ENCOUNTER — Ambulatory Visit: Payer: Self-pay | Admitting: Family Medicine

## 2011-03-06 ENCOUNTER — Ambulatory Visit (INDEPENDENT_AMBULATORY_CARE_PROVIDER_SITE_OTHER): Payer: Managed Care, Other (non HMO) | Admitting: Family Medicine

## 2011-03-06 VITALS — BP 98/62 | HR 76

## 2011-03-06 DIAGNOSIS — I4891 Unspecified atrial fibrillation: Secondary | ICD-10-CM

## 2011-03-06 LAB — POCT INR: INR: 3.3

## 2011-03-06 NOTE — Progress Notes (Signed)
  Subjective:    Patient ID: Sonya Fields, female    DOB: 10/23/1955, 55 y.o.   MRN: [id number] INR check HPI    Review of Systems     Objective:   Physical Exam        Assessment & Plan:   

## 2011-03-12 ENCOUNTER — Ambulatory Visit (INDEPENDENT_AMBULATORY_CARE_PROVIDER_SITE_OTHER): Payer: Managed Care, Other (non HMO) | Admitting: Physician Assistant

## 2011-03-12 VITALS — BP 104/65 | HR 118

## 2011-03-12 DIAGNOSIS — I4891 Unspecified atrial fibrillation: Secondary | ICD-10-CM

## 2011-03-12 NOTE — Progress Notes (Signed)
  Subjective:    Patient ID: Sonya Fields, female    DOB: 12/29/1955, 55 y.o.   MRN: [id number] INR check HPI    Review of Systems     Objective:   Physical Exam        Assessment & Plan:   

## 2011-03-19 ENCOUNTER — Ambulatory Visit (INDEPENDENT_AMBULATORY_CARE_PROVIDER_SITE_OTHER): Payer: Managed Care, Other (non HMO) | Admitting: Family Medicine

## 2011-03-19 VITALS — BP 125/82 | HR 55

## 2011-03-19 DIAGNOSIS — I4891 Unspecified atrial fibrillation: Secondary | ICD-10-CM

## 2011-03-19 NOTE — Progress Notes (Signed)
  Subjective:    Patient ID: Sonya Fields, female    DOB: 1955-04-22, 56 y.o.   MRN: [id number] INR check HPI    Review of Systems     Objective:   Physical Exam        Assessment & Plan:

## 2011-03-20 ENCOUNTER — Other Ambulatory Visit: Payer: Self-pay | Admitting: *Deleted

## 2011-03-20 MED ORDER — WARFARIN SODIUM 4 MG PO TABS: 4.0000 mg | ORAL_TABLET | Freq: Every day | ORAL | Status: DC

## 2011-04-02 ENCOUNTER — Ambulatory Visit (INDEPENDENT_AMBULATORY_CARE_PROVIDER_SITE_OTHER): Payer: Managed Care, Other (non HMO) | Admitting: Family Medicine

## 2011-04-02 VITALS — BP 107/66 | HR 70

## 2011-04-02 DIAGNOSIS — I4891 Unspecified atrial fibrillation: Secondary | ICD-10-CM

## 2011-04-02 NOTE — Progress Notes (Signed)
  Subjective:    Patient ID: Sonya Fields, female    DOB: 1955/04/29, 56 y.o.   MRN: [id number] Here for PT/INR check HPI    Review of Systems     Objective:   Physical Exam        Assessment & Plan:

## 2011-04-28 ENCOUNTER — Other Ambulatory Visit: Payer: Self-pay | Admitting: Family Medicine

## 2012-01-11 ENCOUNTER — Other Ambulatory Visit (HOSPITAL_BASED_OUTPATIENT_CLINIC_OR_DEPARTMENT_OTHER): Payer: Self-pay | Admitting: Family Medicine

## 2012-01-11 DIAGNOSIS — Z1231 Encounter for screening mammogram for malignant neoplasm of breast: Secondary | ICD-10-CM

## 2012-01-14 ENCOUNTER — Ambulatory Visit (HOSPITAL_BASED_OUTPATIENT_CLINIC_OR_DEPARTMENT_OTHER)
Admission: RE | Admit: 2012-01-14 | Discharge: 2012-01-14 | Disposition: A | Discharge status: Home / Self Care | Payer: Managed Care, Other (non HMO) | Source: Ambulatory Visit | Attending: Family Medicine | Admitting: Family Medicine

## 2012-01-14 DIAGNOSIS — Z1231 Encounter for screening mammogram for malignant neoplasm of breast: Secondary | ICD-10-CM | POA: Insufficient documentation

## 2012-12-24 ENCOUNTER — Other Ambulatory Visit: Payer: Self-pay | Admitting: Family Medicine

## 2012-12-24 DIAGNOSIS — Z1231 Encounter for screening mammogram for malignant neoplasm of breast: Secondary | ICD-10-CM

## 2013-01-22 ENCOUNTER — Ambulatory Visit (INDEPENDENT_AMBULATORY_CARE_PROVIDER_SITE_OTHER): Payer: Managed Care, Other (non HMO)

## 2013-01-22 DIAGNOSIS — Z1231 Encounter for screening mammogram for malignant neoplasm of breast: Secondary | ICD-10-CM

## 2013-06-12 ENCOUNTER — Encounter: Payer: Self-pay | Admitting: Emergency Medicine

## 2013-06-12 ENCOUNTER — Emergency Department
Admission: EM | Admit: 2013-06-12 | Discharge: 2013-06-12 | Disposition: A | Discharge status: Home / Self Care | Payer: Managed Care, Other (non HMO) | Source: Home / Self Care | Attending: Family Medicine | Admitting: Family Medicine

## 2013-06-12 DIAGNOSIS — J209 Acute bronchitis, unspecified: Secondary | ICD-10-CM

## 2013-06-12 MED ORDER — BENZONATATE 200 MG PO CAPS: 200.0000 mg | ORAL_CAPSULE | Freq: Every day | ORAL | Status: DC

## 2013-06-12 MED ORDER — AZITHROMYCIN 250 MG PO TABS: ORAL_TABLET | ORAL | Status: DC

## 2013-06-12 NOTE — ED Provider Notes (Signed)
CSN: [id number]     Arrival date & time 06/12/13  1514 History   First MD Initiated Contact with Patient 06/12/13 1526     Chief Complaint  Patient presents with  . Cough      HPI Comments: About 12 days ago patient developed sore throat and sinus congestion, and minimal cough.  Two days ago she developed a fever to 100+ with myalgias, increased fatigue, headache, and wheezing.  She has had intermittent shortness of breath, and sometimes coughs until she gags.                                                                   The history is provided by the patient.    Past Medical History  Diagnosis Date  . Atrial fibrillation   . HLD (hyperlipidemia)   . Mitral stenosis   . Allergic rhinitis     cause unspec  . Anosmia   . Menopause 2009   Past Surgical History  Procedure Laterality Date  . Dental implant  2008  . Mitral valvuloplasty  12/06    at [facility]  . Btl  1992  . Tubal ligation    . Cath (other)  2006    no CAD   Family History  Problem Relation Age of Onset  . Diabetes Mother   . Hyperlipidemia Mother   . Hypertension Mother   . Stroke Father   . Hypertension Father   . Hyperlipidemia Father   . Depression Brother   . Hypertension Brother    History  Substance Use Topics  . Smoking status: Former Games developermoker  . Smokeless tobacco: Not on file     Comment: quit 1988  . Alcohol Use: No   OB History   Grav Para Term Preterm Abortions TAB SAB Ect Mult Living                 Review of Systems + sore throat + cough + hoarseness No pleuritic pain + wheezing + nasal congestion + post-nasal drainage No sinus pain/pressure No itchy/red eyes No earache No hemoptysis + SOB + fever, + chills No nausea No vomiting No abdominal pain No diarrhea No urinary symptoms No skin rash + fatigue + myalgias + headache Used OTC meds without relief  Allergies  Review of patient's allergies indicates not on file.  Home Medications   Prior to Admission  medications   Medication Sig Start Date End Date Taking? Authorizing Provider  acyclovir (ZOVIRAX) 400 MG tablet TAKE 1 TABLET (400 MG TOTAL) BY MOUTH 3 (THREE) TIMES DAILY. X 5 DAYS 04/28/11   Agapito Gamesatherine D Metheney, MD  Calcium Carbonate-Vitamin D 600-400 MG-UNIT per tablet Take 1 tablet by mouth daily.      Historical Provider, MD  metoprolol (TOPROL-XL) 50 MG 24 hr tablet Take 50 mg by mouth daily.      Historical Provider, MD  Pitavastatin Calcium (LIVALO) 1 MG TABS Take 1 tablet (1 mg total) by mouth at bedtime. 10/27/10   Agapito Gamesatherine D Metheney, MD  pravastatin (PRAVACHOL) 40 MG tablet TAKE 1 TABLET AT BEDTIME 07/30/10   Seymour BarsKaren Bowen, DO  warfarin (COUMADIN) 1 MG tablet Take 1 tablet (1 mg total) by mouth as directed. Use as directed 12/21/10   Agapito Gamesatherine D Metheney, MD  warfarin (COUMADIN) 4 MG tablet Take 1 tablet (4 mg total) by mouth daily. 03/20/11   Agapito Gamesatherine D Metheney, MD  warfarin (COUMADIN) 6 MG tablet TAKE 1 TAB BY MOUTH ONCE DAILY 09/18/10   Seymour BarsKaren Bowen, DO   BP 134/80  Pulse 88  Temp(Src) 98.1 F (36.7 C) (Oral)  Ht 5\' 5"  (1.651 m)  Wt 130 lb (58.968 kg)  BMI 21.63 kg/m2  SpO2 98% Physical Exam Nursing notes and Vital Signs reviewed. Appearance:  Patient appears healthy, stated age, and in no acute distress Eyes:  Pupils are equal, round, and reactive to light and accomodation.  Extraocular movement is intact.  Conjunctivae are not inflamed  Ears:  Canals normal.  Tympanic membranes normal.  Nose:  Mildly congested turbinates.  No sinus tenderness.    Pharynx:  Normal Neck:  Supple.   Tender shotty posterior nodes are palpated bilaterally  Lungs:   Faint posterior wheezing bibasilar.  Breath sounds are equal.  Heart:  Regular rate and rhythm without murmurs, rubs, or gallops.  Abdomen:  Nontender without masses or hepatosplenomegaly.  Bowel sounds are present.  No CVA or flank tenderness.  Extremities:  No edema.  No calf tenderness Skin:  No rash present.   ED Course   Procedures  none          MDM   1. Acute bronchitis    Begin Z-pack to cover atypicals.  Prescription written for Benzonatate [facility](Tessalon) to take at bedtime for night-time cough.  Take plain Mucinex (1200 mg guaifenesin) twice daily for cough and congestion.  Increase fluid intake, rest. May use Afrin nasal spray (or generic oxymetazoline) twice daily for about 5 days.  Also recommend using saline nasal spray several times daily and saline nasal irrigation (AYR is a common brand)  Stop all antihistamines for now, and other non-prescription cough/cold preparations.     Follow-up with family doctor if not improving 7 to 10 days.     Lattie HawStephen A Phylliss Strege, MD 06/12/13 2014

## 2013-06-12 NOTE — ED Notes (Signed)
Productive cough x 1 week, today had throat spasms and couldn't breath. Wed had fever and chills

## 2013-06-12 NOTE — Discharge Instructions (Signed)
Take plain Mucinex (1200 mg guaifenesin) twice daily for cough and congestion.  Increase fluid intake, rest. °May use Afrin nasal spray (or generic oxymetazoline) twice daily for about 5 days.  Also recommend using saline nasal spray several times daily and saline nasal irrigation (AYR is a common brand) °Stop all antihistamines for now, and other non-prescription cough/cold preparations. °Follow-up with family doctor if not improving 7 to 10 days.  °

## 2015-01-24 ENCOUNTER — Other Ambulatory Visit: Payer: Self-pay | Admitting: Family Medicine

## 2015-01-24 DIAGNOSIS — Z1231 Encounter for screening mammogram for malignant neoplasm of breast: Secondary | ICD-10-CM

## 2015-02-16 ENCOUNTER — Ambulatory Visit (INDEPENDENT_AMBULATORY_CARE_PROVIDER_SITE_OTHER): Payer: Managed Care, Other (non HMO)

## 2015-02-16 DIAGNOSIS — Z1231 Encounter for screening mammogram for malignant neoplasm of breast: Secondary | ICD-10-CM | POA: Diagnosis not present

## 2016-08-04 ENCOUNTER — Emergency Department (INDEPENDENT_AMBULATORY_CARE_PROVIDER_SITE_OTHER): Payer: Managed Care, Other (non HMO)

## 2016-08-04 ENCOUNTER — Encounter: Payer: Self-pay | Admitting: Emergency Medicine

## 2016-08-04 ENCOUNTER — Other Ambulatory Visit: Payer: Self-pay

## 2016-08-04 ENCOUNTER — Telehealth: Payer: Self-pay | Admitting: Emergency Medicine

## 2016-08-04 ENCOUNTER — Emergency Department
Admission: EM | Admit: 2016-08-04 | Discharge: 2016-08-04 | Disposition: A | Discharge status: Home / Self Care | Payer: Managed Care, Other (non HMO) | Source: Home / Self Care | Attending: Family Medicine | Admitting: Family Medicine

## 2016-08-04 DIAGNOSIS — J189 Pneumonia, unspecified organism: Secondary | ICD-10-CM

## 2016-08-04 DIAGNOSIS — J441 Chronic obstructive pulmonary disease with (acute) exacerbation: Secondary | ICD-10-CM | POA: Diagnosis not present

## 2016-08-04 DIAGNOSIS — R0602 Shortness of breath: Secondary | ICD-10-CM

## 2016-08-04 DIAGNOSIS — F410 Panic disorder [episodic paroxysmal anxiety] without agoraphobia: Secondary | ICD-10-CM

## 2016-08-04 DIAGNOSIS — J918 Pleural effusion in other conditions classified elsewhere: Secondary | ICD-10-CM

## 2016-08-04 DIAGNOSIS — I4892 Unspecified atrial flutter: Secondary | ICD-10-CM

## 2016-08-04 DIAGNOSIS — R0600 Dyspnea, unspecified: Secondary | ICD-10-CM

## 2016-08-04 LAB — POCT CBC W AUTO DIFF (K'VILLE URGENT CARE)

## 2016-08-04 LAB — TROPONIN I: Troponin I: 0.01 ng/mL (ref <=0.05)

## 2016-08-04 IMAGING — DX DG CHEST 2V
2 series · 2 of 2 positions shown · non-contrast
Comparison: None.

CLINICAL DATA: Dyspnea

EXAM:
CHEST  2 VIEW

[chest pa]
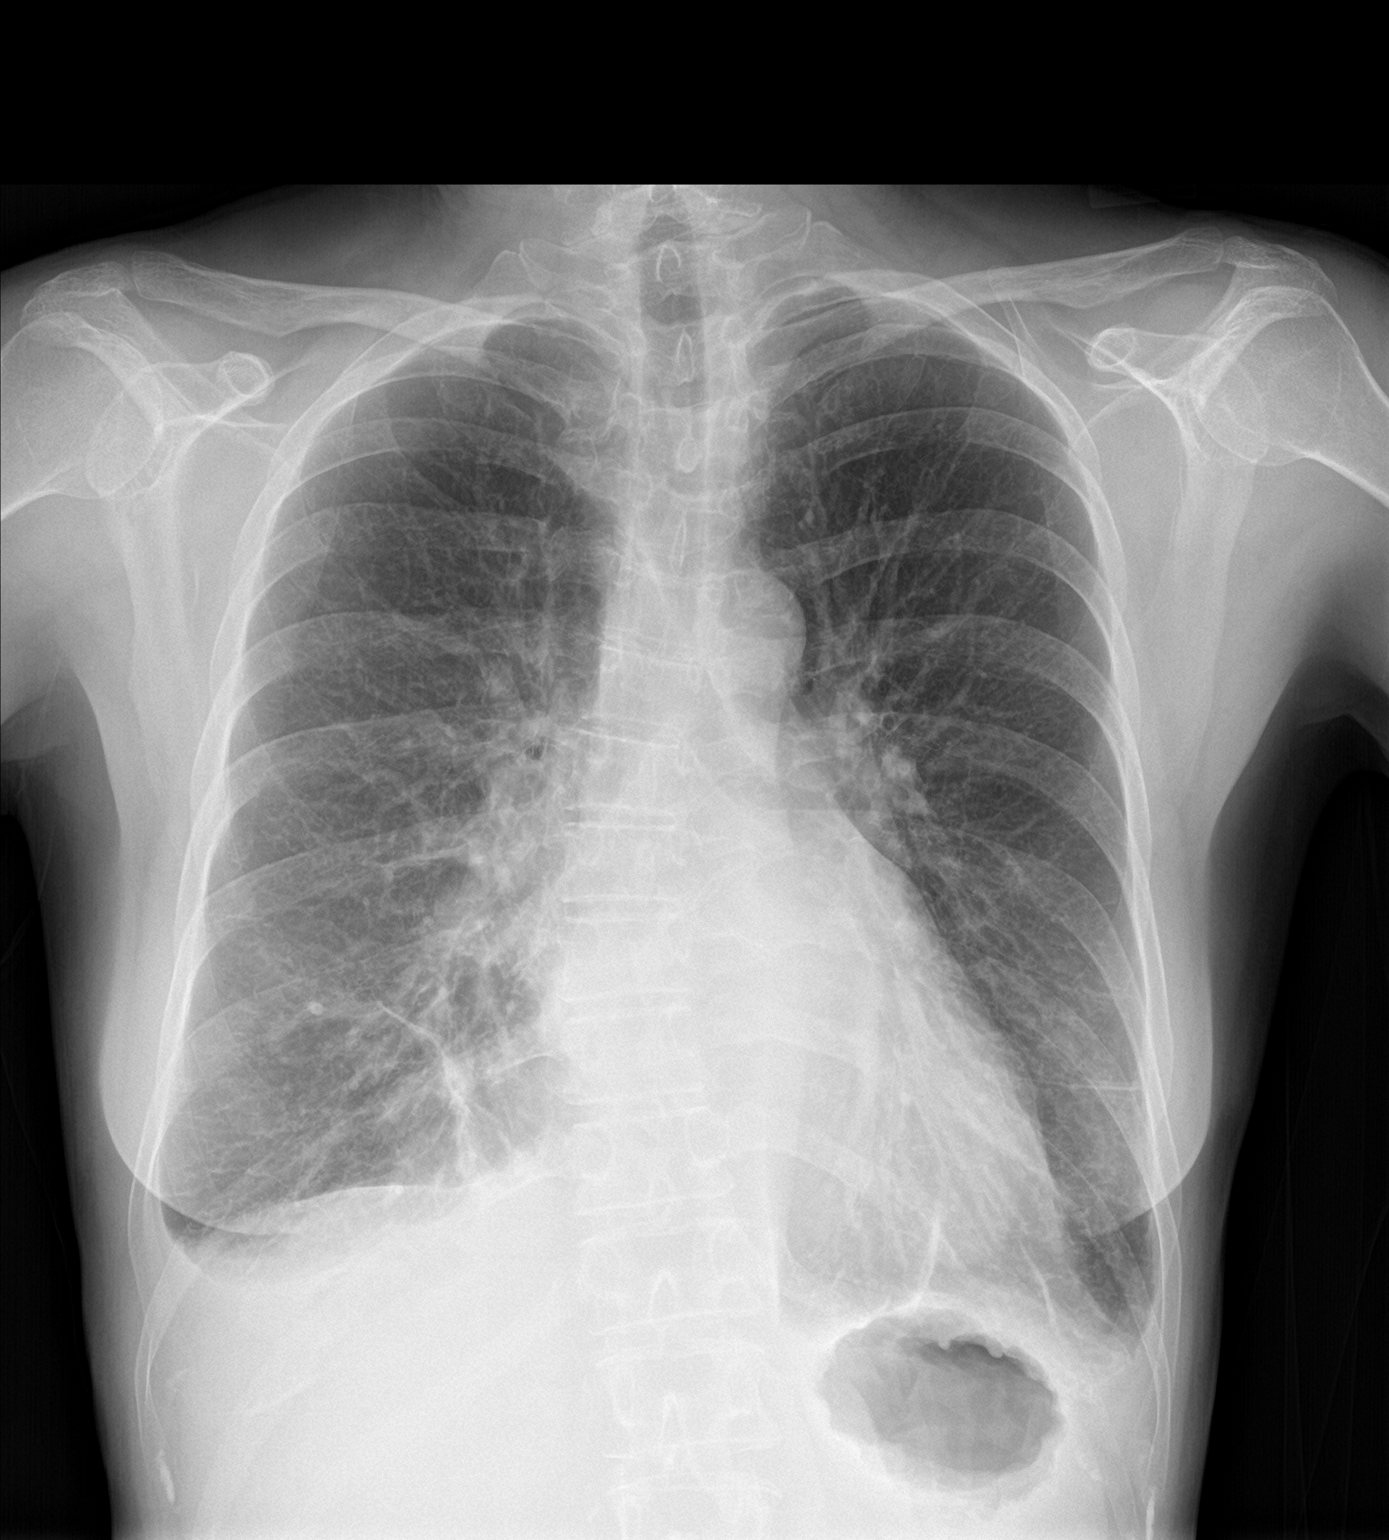

[chest lat]
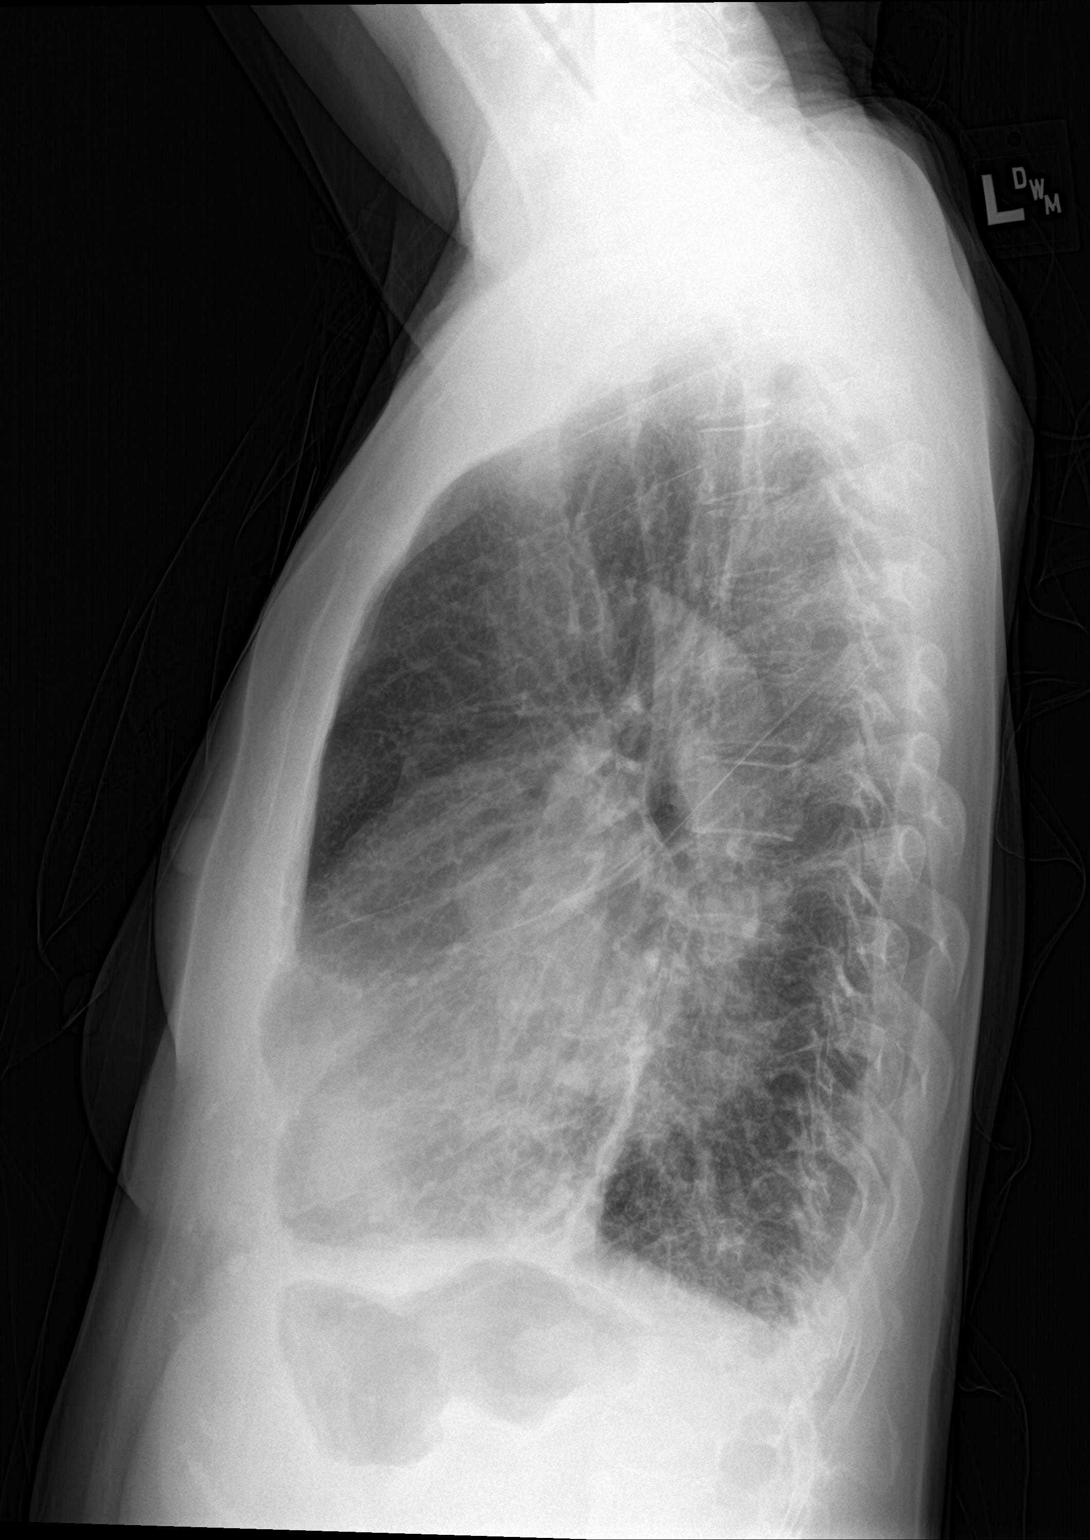

[2 of 2 positions shown; findings below may reference images not displayed]

FINDINGS: Normal heart size. Normal mediastinal contour. No pneumothorax. Mild
blunting of the costophrenic angles bilaterally. Mildly
hyperinflated lungs. No overt pulmonary edema. Hazy and curvilinear
bibasilar lung opacities.
IMPRESSION: 1. Hazy and curvilinear bibasilar lung opacities, favor scarring
and/or atelectasis, difficult to exclude mild pneumonitis/developing
pneumonia. Recommend follow-up PA and lateral post treatment chest
radiographs in 4-6 weeks.
2. Mild blunting of the costophrenic angles bilaterally, cannot
exclude small pleural effusions .
3. Hyperinflated lungs, suggesting COPD.

## 2016-08-04 MED ORDER — BENZONATATE 100 MG PO CAPS: 100.0000 mg | ORAL_CAPSULE | Freq: Three times a day (TID) | ORAL | 0 refills | Status: DC

## 2016-08-04 MED ORDER — CLINDAMYCIN HCL 300 MG PO CAPS: 300.0000 mg | ORAL_CAPSULE | Freq: Four times a day (QID) | ORAL | 0 refills | Status: DC

## 2016-08-04 NOTE — ED Triage Notes (Signed)
Pt c/o SOB that started today. Strep last week she is on abx for that. She is in Afib and states her throat "feels like it is closing".

## 2016-08-04 NOTE — Discharge Instructions (Signed)
° °  Please be sure to call your primary care provider on Monday to discuss today's visit and schedule a follow up appointment this week for recheck of symptoms.   We will call you later today with the results of your troponin (cardiac enzyme) test, however, if symptoms worsen before you hear from us, please do not hesitate to call 911 or go to closest emergency department for further evaluation and treatment of symptoms.

## 2016-08-04 NOTE — ED Provider Notes (Signed)
CSN: [id number]     Arrival date & time 08/04/16  1102 History   None    Chief Complaint  Patient presents with  . Shortness of Breath   (Consider location/radiation/quality/duration/timing/severity/associated sxs/prior Treatment) HPI Sonya Fields is a 61 y.o. female presenting to UC with husband c/o sudden onset SOB while she was sitting in a chair this morning.  Pt notes she felt like her throat was closing up but also notes she gets panicked when she cannot breath.  She was recently dx with strep throat and has been taking amoxicillin but she has had intermittent low-grade fever all week.  She also reports hx of afib and was advised by her cardiologist to increase her rate control medication yesterday.  She is concerned symptoms are due to her recently dx strep throat.  She notes her PCP advised her to come to [facility] for a CXR.  She is on warfarin for her afib and has been on that for a few years w/o recent change in dose.  Denies n/v/d. Denies chest pain.  Past Medical History:  Diagnosis Date  . Allergic rhinitis    cause unspec  . Anosmia   . Atrial fibrillation (HCC)   . HLD (hyperlipidemia)   . Menopause 2009  . Mitral stenosis    Past Surgical History:  Procedure Laterality Date  . btl  1992  . Cath (other)  2006   no CAD  . dental implant  2008  . MITRAL VALVULOPLASTY  12/06   at [facility]  . TUBAL LIGATION     Family History  Problem Relation Age of Onset  . Diabetes Mother   . Hyperlipidemia Mother   . Hypertension Mother   . Stroke Father   . Hypertension Father   . Hyperlipidemia Father   . Depression Brother   . Hypertension Brother    Social History  Substance Use Topics  . Smoking status: Former Games developer  . Smokeless tobacco: Never Used     Comment: quit 1988  . Alcohol use No   OB History    No data available     Review of Systems  Constitutional: Negative for chills and fever.  HENT: Positive for sore throat. Negative for congestion, ear pain,  trouble swallowing and voice change.   Respiratory: Positive for cough, chest tightness and shortness of breath.   Cardiovascular: Negative for chest pain and palpitations.  Gastrointestinal: Negative for abdominal pain, diarrhea, nausea and vomiting.  Musculoskeletal: Negative for arthralgias, back pain and myalgias.  Skin: Negative for rash.    Allergies  Patient has no allergy information on record.  Home Medications   Prior to Admission medications   Medication Sig Start Date End Date Taking? Authorizing Provider  acyclovir (ZOVIRAX) 400 MG tablet TAKE 1 TABLET (400 MG TOTAL) BY MOUTH 3 (THREE) TIMES DAILY. X 5 DAYS 04/28/11   Agapito Games, MD  azithromycin (ZITHROMAX Z-PAK) 250 MG tablet Take 2 tabs today; then begin one tab once daily for 4 more days. 06/12/13   Lattie Haw, MD  benzonatate (TESSALON) 100 MG capsule Take 1-2 capsules (100-200 mg total) by mouth every 8 (eight) hours. 08/04/16   Lurene Shadow, PA-C  Calcium Carbonate-Vitamin D 600-400 MG-UNIT per tablet Take 1 tablet by mouth daily.      [provider]  clindamycin (CLEOCIN) 300 MG capsule Take 1 capsule (300 mg total) by mouth 4 (four) times daily. X 7 days 08/04/16   Lurene Shadow, PA-C  metoprolol (TOPROL-XL) 50 MG 24 hr tablet Take 50 mg by mouth daily.      [provider]  Pitavastatin Calcium (LIVALO) 1 MG TABS Take 1 tablet (1 mg total) by mouth at bedtime. 10/27/10   Agapito Games, MD  pravastatin (PRAVACHOL) 40 MG tablet TAKE 1 TABLET AT BEDTIME 07/30/10   Bowen, Scot Jun, DO  warfarin (COUMADIN) 1 MG tablet Take 1 tablet (1 mg total) by mouth as directed. Use as directed 12/21/10   Agapito Games, MD  warfarin (COUMADIN) 4 MG tablet Take 1 tablet (4 mg total) by mouth daily. 03/20/11   Agapito Games, MD  warfarin (COUMADIN) 6 MG tablet TAKE 1 TAB BY MOUTH ONCE DAILY 09/18/10   Bowen, Scot Jun, DO   Meds Ordered and Administered this Visit  Medications - No data  to display  BP 130/80 (BP Location: Right Arm)   Pulse (!) 130   Temp 98.8 F (37.1 C) (Oral)   SpO2 98%  No data found.   Physical Exam  Constitutional: She is oriented to person, place, and time. She appears well-developed and well-nourished. She appears distressed.  Anxious appearing, tri-poding. Short of breath  HENT:  Head: Normocephalic and atraumatic.  Eyes: EOM are normal.  Neck: Normal range of motion.  Cardiovascular: Regular rhythm.  Tachycardia present.   Pulmonary/Chest: Effort normal. Tachypnea noted. She has wheezes. She has no rales.  Faint inspiratory wheeze. Mildly SOB but able to speak when calmed down. Able to instruct pt to take full deep breaths slowly.   Musculoskeletal: Normal range of motion.  Neurological: She is alert and oriented to person, place, and time.  Skin: Skin is warm and dry. She is not diaphoretic.  Psychiatric: Her behavior is normal. Her mood appears anxious.  Nursing note and vitals reviewed.   Urgent Care Course     Procedures (including critical care time)  Labs Review Labs Reviewed  TROPONIN I   Narrative:    Performed at:  [facility]                [address], [address]                [place], [place] [address]  POCT CBC W AUTO DIFF ([facility])    Imaging Review Dg Chest 2 View  Result Date: 08/04/2016 CLINICAL DATA:  Dyspnea EXAM: CHEST  2 VIEW COMPARISON:  None. FINDINGS: Normal heart size. Normal mediastinal contour. No pneumothorax. Mild blunting of the costophrenic angles bilaterally. Mildly hyperinflated lungs. No overt pulmonary edema. Hazy and curvilinear bibasilar lung opacities. IMPRESSION: 1. Hazy and curvilinear bibasilar lung opacities, favor scarring and/or atelectasis, difficult to exclude mild pneumonitis/developing pneumonia. Recommend follow-up PA and lateral post treatment chest radiographs in 4-6 weeks. 2. Mild blunting of the costophrenic angles bilaterally, cannot exclude small  pleural effusions . 3. Hyperinflated lungs, suggesting COPD. Electronically Signed   By: Delbert Phenix M.D.   On: 08/04/2016 11:53    During exam, pt appeared to calm down and no longer be SOB.  Within 5-10 minutes of being in exam room, pt sitting on exam bed appearing well with NAD.  Offered to call EMS to take pt to ED or have husband take her to ED for further evaluation but pt declined stating she is already feeling better.  Date/Time: 08/04/2016    11:18:12 Ventricular Rate: 164 PR Interval: * QRS Duration: 88 QT Interval: 306 QTC Calculation: 505 P-R-T axes: *  100   -  83 Text Interpretation: Atrial flutter with variable AV block.  Rightward axis. ST & T wave abnormality, consider inferolateral ischemia.  Abnormal ECG  Change from EKG performed with [facility] on 07/30/16- text interpretation- Sinus rhythm, PAT noted.  EKG from 06/30/16- NSR, rightward axis, incomplete RBB, nonspecific ST abnormality.     MDM   1. COPD exacerbation (HCC)   2. Pleural effusion associated with pulmonary infection   3. SOB (shortness of breath)   4. Atrial flutter, unspecified type (HCC)   5. Anxiety attack    CXR: concerning for bilateral lower lobe pneumonia. Pt is on warfarin, will start pt on Clindamycin.  Troponin sent stat lab as pt is not wanting to go to ED.   3:19PM Troponin- 0.01  Encouraged pt to take new antibiotics as prescribed and to call her PCP on Monday 6/18 for f/u Discussed symptoms that warrant emergent care in the ED. Pt and husband agreeable with plan.      Lurene Shadowhelps, Saivion Goettel O, [place]PA-C 08/04/16 1554

## 2016-08-04 NOTE — ED Triage Notes (Signed)
Ordered stat troponin per erin omalley pa. conf with corwin at [facility]. #[id number] spin and freeze in plastic transport tube.

## 2016-08-05 ENCOUNTER — Telehealth: Payer: Self-pay | Admitting: Emergency Medicine

## 2016-08-05 NOTE — Telephone Encounter (Signed)
Spoke w/pt states that she was discharged from the hospital today, started on Lasix, she had fluid in her lungs, heart beating irregular.  Will follow up with cardiologist.  Dell PontoMartin,CMA

## 2016-11-09 ENCOUNTER — Other Ambulatory Visit: Payer: Self-pay | Admitting: Family Medicine

## 2016-11-09 ENCOUNTER — Ambulatory Visit (INDEPENDENT_AMBULATORY_CARE_PROVIDER_SITE_OTHER): Payer: Managed Care, Other (non HMO)

## 2016-11-09 DIAGNOSIS — Z1231 Encounter for screening mammogram for malignant neoplasm of breast: Secondary | ICD-10-CM

## 2016-11-09 DIAGNOSIS — Z1239 Encounter for other screening for malignant neoplasm of breast: Secondary | ICD-10-CM

## 2020-07-05 LAB — EXTERNAL GENERIC LAB PROCEDURE: COLOGUARD: NEGATIVE

## 2020-07-05 LAB — COLOGUARD: COLOGUARD: NEGATIVE

## 2023-06-12 ENCOUNTER — Encounter: Payer: Self-pay | Admitting: Family Medicine

## 2023-06-12 ENCOUNTER — Ambulatory Visit (INDEPENDENT_AMBULATORY_CARE_PROVIDER_SITE_OTHER): Admitting: Family Medicine

## 2023-06-12 VITALS — BP 113/77 | HR 92 | Temp 97.9°F | Resp 16 | Ht 65.0 in | Wt 130.0 lb

## 2023-06-12 DIAGNOSIS — Z23 Encounter for immunization: Secondary | ICD-10-CM | POA: Insufficient documentation

## 2023-06-12 DIAGNOSIS — Z7689 Persons encountering health services in other specified circumstances: Secondary | ICD-10-CM | POA: Diagnosis not present

## 2023-06-12 DIAGNOSIS — Z952 Presence of prosthetic heart valve: Secondary | ICD-10-CM

## 2023-06-12 DIAGNOSIS — M810 Age-related osteoporosis without current pathological fracture: Secondary | ICD-10-CM | POA: Diagnosis not present

## 2023-06-12 DIAGNOSIS — Z1211 Encounter for screening for malignant neoplasm of colon: Secondary | ICD-10-CM

## 2023-06-12 NOTE — Progress Notes (Signed)
 New Patient Office Visit  Subjective    Patient ID: Sonya Fields, female    DOB: 25-Apr-1955  Age: 68 y.o. MRN: [id number]  CC:  Chief Complaint  Patient presents with   New Patient (Initial Visit)    Est. Care    HPI Sonya Fields presents to establish care with this practice. She is new to me.   Mitral valve replacement last Wednesday. Minimally invasive.  Will continue with [facility] for mitral valve care. Wants to transfer all other care to this practice.  Health maintenance reviewed. Has had primary care with [facility] medical. Dexa scan 10/ 2023 osteoporosis. Mammogram done 03/28/23: normal Last pap smear normal, no longer required by age.  Unsure of last Medicare Wellness Exam. Will get record sent to this  office.   Oral herpes: on acyclovir  400 mg TID with outbreaks.  Mitral Valve replacement: Has a pig valve.  On amiodarone 200 mg  ASA 81 mg Lasix 20 mg Going to cardiac rehab next week per cards.  Going to coumadin  clinic every week.   Has not taken oxycodone in several days.  Osteoporosis: On prolia in past, too expensive. Fosamax did not tolerate.  Taking calcium , Vit D and magnesium. Does not do weight bearing   Chart review: Admitted to [facility] for mitral valve stenosis on 06/09/23.  Hx of rheumatic mitral  regurgitation.  03/29/22: Echo: EF 55-60% 03/29/22 Sees coumadin  clinic with [facility]           Outpatient Encounter Medications as of 06/12/2023  Medication Sig   acyclovir  (ZOVIRAX ) 400 MG tablet TAKE 1 TABLET (400 MG TOTAL) BY MOUTH 3 (THREE) TIMES DAILY. X 5 DAYS   amiodarone (PACERONE) 200 MG tablet Take 1 tablet by mouth daily.   aspirin 81 MG chewable tablet Chew 81 mg by mouth once.   furosemide (LASIX) 20 MG tablet 20mg  daily x7 days the Take one tablet daily as needed for lower extremity swelling or weight gain >3 pounds in 1 day or >5 pounds in 1 week   Magnesium Citrate 100 MG CAPS Take 1 capsule by mouth.    metoprolol tartrate (LOPRESSOR) 25 MG tablet Take 37.5 mg by mouth.   oxyCODONE (OXY IR/ROXICODONE) 5 MG immediate release tablet Take 5 mg by mouth every 4 (four) hours as needed for moderate pain (pain score 4-6).   warfarin (COUMADIN ) 5 MG tablet Indications: treatment to prevent blood clots in chronic atrial fibrillation. TAKE 1 TABLET BY MOUTH ONCE DAILY, EXCEPT  1/2  TABLET  BY  MOUTH  ON  MONDAY WEDNESDAYS and FRIDAYS  OR  AS  DIRECTED   warfarin (COUMADIN ) 1 MG tablet Take 1 tablet (1 mg total) by mouth as directed. Use as directed   warfarin (COUMADIN ) 4 MG tablet Take 1 tablet (4 mg total) by mouth daily.   warfarin (COUMADIN ) 6 MG tablet TAKE 1 TAB BY MOUTH ONCE DAILY   [DISCONTINUED] azithromycin  (ZITHROMAX  Z-PAK) 250 MG tablet Take 2 tabs today; then begin one tab once daily for 4 more days.   [DISCONTINUED] benzonatate  (TESSALON ) 100 MG capsule Take 1-2 capsules (100-200 mg total) by mouth every 8 (eight) hours.   [DISCONTINUED] Calcium  Carbonate-Vitamin D 600-400 MG-UNIT per tablet Take 1 tablet by mouth daily.     [DISCONTINUED] clindamycin  (CLEOCIN ) 300 MG capsule Take 1 capsule (300 mg total) by mouth 4 (four) times daily. X 7 days   [DISCONTINUED] metoprolol (TOPROL-XL) 50 MG 24 hr tablet Take 50 mg  by mouth daily.     [DISCONTINUED] Pitavastatin  Calcium  (LIVALO ) 1 MG TABS Take 1 tablet (1 mg total) by mouth at bedtime.   [DISCONTINUED] pravastatin (PRAVACHOL) 40 MG tablet TAKE 1 TABLET AT BEDTIME   No facility-administered encounter medications on file as of 06/12/2023.    Past Medical History:  Diagnosis Date   Allergic rhinitis    cause unspec   Anosmia    Anxiety    Arthritis    Atrial fibrillation (HCC)    CHF (congestive heart failure) (HCC)    COPD (chronic obstructive pulmonary disease) (HCC)    GERD (gastroesophageal reflux disease)    Heart murmur    HLD (hyperlipidemia)    Menopause 02/20/2007   Mitral stenosis    Osteoporosis     Past Surgical  History:  Procedure Laterality Date   btl  02/19/1990   CARDIAC VALVE REPLACEMENT  06/05/2023   Cath (other)  02/20/2004   no CAD   dental implant  02/19/2006   MITRAL VALVULOPLASTY  01/19/2005   at [facility]   TUBAL LIGATION      Family History  Problem Relation Age of Onset   Diabetes Mother    Hyperlipidemia Mother    Hypertension Mother    Arthritis Mother    Stroke Father    Hypertension Father    Hyperlipidemia Father    Asthma Father    Vision loss Father    Arthritis Brother    Asthma Brother    Depression Brother    Hypertension Brother    Obesity Brother    Depression Brother    Hypertension Brother    Alcohol abuse Son    Drug abuse Son    Alcohol abuse Brother     Social History   Socioeconomic History   Marital status: Married    Spouse name: Not on file   Number of children: Not on file   Years of education: Not on file   Highest education level: Some college, no degree  Occupational History   Not on file  Tobacco Use   Smoking status: Former    Current packs/day: 0.00    Average packs/day: 0.5 packs/day for 14.0 years (7.0 ttl pk-yrs)    Types: Cigarettes    Start date: 02/19/1973    Quit date: 02/19/1986    Years since quitting: 37.3   Smokeless tobacco: Never   Tobacco comments:    I quit at least 35 years ago  Substance and Sexual Activity   Alcohol use: No   Drug use: Never   Sexual activity: Yes    Birth control/protection: Post-menopausal  Other Topics Concern   Not on file  Social History Narrative   Walks some exercise.    Social Drivers of Corporate investment banker Strain: Low Risk  (06/10/2023)   Overall Financial Resource Strain (CARDIA)    Difficulty of Paying Living Expenses: Not hard at all  Food Insecurity: No Food Insecurity (06/10/2023)   Hunger Vital Sign    Worried About Running Out of Food in the Last Year: Never true    Ran Out of Food in the Last Year: Never true  Transportation Needs: No Transportation Needs  (06/10/2023)   PRAPARE - Administrator, Civil Service (Medical): No    Lack of Transportation (Non-Medical): No  Physical Activity: Insufficiently Active (06/10/2023)   Exercise Vital Sign    Days of Exercise per Week: 3 days    Minutes of Exercise per Session: 20 min  Stress: Stress Concern Present (06/10/2023)   [employer] of Occupational Health - Occupational Stress Questionnaire    Feeling of Stress : To some extent  Social Connections: Socially Integrated (06/10/2023)   Social Connection and Isolation Panel [NHANES]    Frequency of Communication with Friends and Family: More than three times a week    Frequency of Social Gatherings with Friends and Family: Twice a week    Attends Religious Services: More than 4 times per year    Active Member of [employer] or Organizations: Yes    Attends Engineer, structural: More than 4 times per year    Marital Status: Married  Catering manager Violence: Not At Risk (05/08/2023)   Received from [facility]   HITS    Over the last 12 months how often did your partner physically hurt you?: Never    Over the last 12 months how often did your partner insult you or talk down to you?: Never    Over the last 12 months how often did your partner threaten you with physical harm?: Never    Over the last 12 months how often did your partner scream or curse at you?: Never    Review of Systems  Respiratory:  Positive for shortness of breath (COPD (former smoker)).   Cardiovascular:  Negative for chest pain and leg swelling.  Gastrointestinal:  Negative for nausea and vomiting.  Psychiatric/Behavioral:  Negative for depression and suicidal ideas.         Objective    BP 113/77 (BP Location: Left Arm, Patient Position: Sitting, Cuff Size: Normal)   Pulse 92   Temp 97.9 F (36.6 C) (Oral)   Resp 16   Ht 5\' 5"  (1.651 m)   Wt 130 lb (59 kg)   BMI 21.63 kg/m   Physical Exam Vitals and nursing note reviewed.   Constitutional:      General: She is not in acute distress.    Appearance: Normal appearance. She is normal weight.  Cardiovascular:     Rate and Rhythm: Regular rhythm.     Heart sounds: Normal heart sounds.  Pulmonary:     Effort: Pulmonary effort is normal.     Breath sounds: Normal breath sounds.  Skin:    General: Skin is warm and dry.  Neurological:     General: No focal deficit present.     Mental Status: She is alert. Mental status is at baseline.  Psychiatric:        Mood and Affect: Mood normal.        Behavior: Behavior normal.        Thought Content: Thought content normal.        Judgment: Judgment normal.        Assessment & Plan:   Problem List Items Addressed This Visit     Screening for colon cancer - Primary   Relevant Orders   Cologuard   H/O mitral valve replacement   Mitral valve replacement last week. She has a pig valve. Under the care of cardiology with [facility]. Warfarin clinic weekly. She will continue with cards for care. Incision on right chest under right breast healing. She is still very bruised from surgery, otherwise, she seems to be doing well. Endorses some shortness of breath. No obvious SOB in office today.  Follow-up as instructed with cardiology.       Osteoporosis   Reports last dexa scan showed osteoporosis. Unable to verify this result.  She will get this record  sent to this office. Has been on Prolia in the past, stopped due to cost. Bone scan due in October. Taking vitamin D,  calcium , and magnesium. Discussed weight bearing exercise to improve bone health. Did not tolerate Fosamax in the past.      Agrees with plan of care discussed.  Questions answered.   Return in about 23 weeks (around 11/20/2023) for osteoporosis .   Mickiel Albany, FNP

## 2023-06-12 NOTE — Assessment & Plan Note (Signed)
 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 She has a pig valve. Under the care of cardiology with [facility]. Warfarin clinic weekly. She will continue with cards for care. Incision on right chest under right breast healing. She is still very bruised from surgery, otherwise, she seems to be doing well. Endorses some shortness of breath. No obvious SOB in office today.  Follow-up as instructed with cardiology.

## 2023-06-12 NOTE — Assessment & Plan Note (Addendum)
 Reports last dexa scan showed osteoporosis. Unable to verify this result.  She will get this record sent to this office. Has been on Prolia in the past, stopped due to cost. Bone scan due in October. Taking vitamin D,  calcium , and magnesium. Discussed weight bearing exercise to improve bone health. Did not tolerate Fosamax in the past.

## 2023-07-06 ENCOUNTER — Encounter: Payer: Self-pay | Admitting: Family Medicine

## 2023-07-16 ENCOUNTER — Ambulatory Visit: Payer: Self-pay | Admitting: Family Medicine

## 2023-07-16 LAB — COLOGUARD: COLOGUARD: NEGATIVE

## 2023-09-25 ENCOUNTER — Other Ambulatory Visit: Payer: Self-pay | Admitting: Medical Genetics

## 2023-10-01 ENCOUNTER — Other Ambulatory Visit

## 2023-10-01 DIAGNOSIS — Z006 Encounter for examination for normal comparison and control in clinical research program: Secondary | ICD-10-CM

## 2023-10-11 LAB — GENECONNECT MOLECULAR SCREEN: Genetic Analysis Overall Interpretation: NEGATIVE

## 2023-10-22 ENCOUNTER — Inpatient Hospital Stay: Admitting: Family Medicine

## 2023-10-31 ENCOUNTER — Ambulatory Visit
Admission: EM | Admit: 2023-10-31 | Discharge: 2023-10-31 | Disposition: A | Discharge status: Home / Self Care | Attending: Family Medicine | Admitting: Family Medicine

## 2023-10-31 ENCOUNTER — Other Ambulatory Visit: Payer: Self-pay

## 2023-10-31 DIAGNOSIS — N3001 Acute cystitis with hematuria: Secondary | ICD-10-CM | POA: Insufficient documentation

## 2023-10-31 LAB — POCT URINALYSIS DIP (MANUAL ENTRY)
Bilirubin, UA: NEGATIVE
Glucose, UA: NEGATIVE mg/dL
Ketones, POC UA: NEGATIVE mg/dL
Nitrite, UA: POSITIVE — AB
Protein Ur, POC: NEGATIVE mg/dL
Spec Grav, UA: 1.01 (ref 1.010–1.025)
Urobilinogen, UA: 0.2 U/dL
pH, UA: 5.5 (ref 5.0–8.0)

## 2023-10-31 MED ORDER — CEFDINIR 300 MG PO CAPS: 300.0000 mg | ORAL_CAPSULE | Freq: Two times a day (BID) | ORAL | 0 refills | Status: AC

## 2023-10-31 NOTE — ED Triage Notes (Signed)
 Has been having urinary pain and frequency x 4-5 days, waxes and wanes. No fever. No otc meds.

## 2023-10-31 NOTE — Discharge Instructions (Addendum)
 Advised patient take medication as directed with food to completion.  Encouraged to increase daily water intake to 64 ounces per day while taking this medication.  Advised we will follow-up with urine culture results once received.  Advised if symptoms worsen and/or unresolved please follow-up with your PCP or here for further evaluation.

## 2023-10-31 NOTE — ED Provider Notes (Signed)
 [facility]    CSN: [id number] Arrival date & time: 10/31/23  1528      History   Chief Complaint Chief Complaint  Patient presents with   Dysuria    HPI Sonya Fields is a 68 y.o. female.   HPI very pleasant 68 year old female presents with urinary frequency and dysuria for 4 to 5 days. PMH significant for CHF, atrial fibrillation, and COPD. Patient is currently on Coumadin  and denies any unusual bleeding.  Past Medical History:  Diagnosis Date   Allergic rhinitis    cause unspec   Anosmia    Anxiety    Arthritis    Atrial fibrillation (HCC)    CHF (congestive heart failure) (HCC)    COPD (chronic obstructive pulmonary disease) (HCC)    GERD (gastroesophageal reflux disease)    Heart murmur    HLD (hyperlipidemia)    Menopause 02/20/2007   Mitral stenosis    Osteoporosis     Patient Active Problem List   Diagnosis Date Noted   Screening for colon cancer 06/12/2023   H/O mitral valve replacement 06/12/2023   Osteoporosis 06/12/2023   Disorder of bone and cartilage 01/07/2010   Asymptomatic postmenopausal status 12/26/2009   Vitamin D deficiency 12/22/2009   MITRAL VALVE REPLACEMENT, HX OF 12/06/2008   HYPERLIPIDEMIA 08/21/2008   MITRAL STENOSIS 08/21/2008   Allergic rhinitis 05/09/2008   ANOSMIA 09/02/2007   ATRIAL FIBRILLATION 08/05/2007    Past Surgical History:  Procedure Laterality Date   btl  02/19/1990   CARDIAC VALVE REPLACEMENT  06/05/2023   Cath (other)  02/20/2004   no CAD   dental implant  02/19/2006   MITRAL VALVULOPLASTY  01/19/2005   at [facility]   TUBAL LIGATION      OB History   No obstetric history on file.      Home Medications    Prior to Admission medications   Medication Sig Start Date End Date Taking? Authorizing Provider  cefdinir  (OMNICEF ) 300 MG capsule Take 1 capsule (300 mg total) by mouth 2 (two) times daily for 7 days. 10/31/23 11/07/23 Yes Teddy Sharper, FNP  amiodarone (PACERONE) 200 MG tablet Take 1  tablet by mouth daily. 06/10/23   [provider]  aspirin 81 MG chewable tablet Chew 81 mg by mouth once. 06/10/23   [provider]  Magnesium Citrate 100 MG CAPS Take 1 capsule by mouth. 06/10/19   [provider]  metoprolol tartrate (LOPRESSOR) 25 MG tablet Take 37.5 mg by mouth. 06/09/23   [provider]  warfarin (COUMADIN ) 5 MG tablet Indications: treatment to prevent blood clots in chronic atrial fibrillation. TAKE 1 TABLET BY MOUTH ONCE DAILY, EXCEPT  1/2  TABLET  BY  MOUTH  ON  MONDAY WEDNESDAYS and FRIDAYS  OR  AS  DIRECTED 03/21/16   [provider]    Family History Family History  Problem Relation Age of Onset   Diabetes Mother    Hyperlipidemia Mother    Hypertension Mother    Arthritis Mother    Stroke Father    Hypertension Father    Hyperlipidemia Father    Asthma Father    Vision loss Father    Arthritis Brother    Asthma Brother    Depression Brother    Hypertension Brother    Obesity Brother    Depression Brother    Hypertension Brother    Alcohol abuse Son    Drug abuse Son    Alcohol abuse Brother  Social History Social History   Tobacco Use   Smoking status: Former    Current packs/day: 0.00    Average packs/day: 0.5 packs/day for 14.0 years (7.0 ttl pk-yrs)    Types: Cigarettes    Start date: 02/19/1973    Quit date: 02/19/1986    Years since quitting: 37.7   Smokeless tobacco: Never   Tobacco comments:    I quit at least 35 years ago  Substance Use Topics   Alcohol use: No   Drug use: Never     Allergies   Patient has no known allergies.   Review of Systems Review of Systems  Genitourinary:  Positive for dysuria and frequency.     Physical Exam Triage Vital Signs ED Triage Vitals  Encounter Vitals Group     BP      Girls Systolic BP Percentile      Girls Diastolic BP Percentile      Boys Systolic BP Percentile      Boys Diastolic BP Percentile      Pulse      Resp      Temp       Temp src      SpO2      Weight      Height      Head Circumference      Peak Flow      Pain Score      Pain Loc      Pain Education      Exclude from Growth Chart    No data found.  Updated Vital Signs BP 120/71   Pulse 77   Temp 97.8 F (36.6 C)   Resp 16   SpO2 96%   Visual Acuity Right Eye Distance:   Left Eye Distance:   Bilateral Distance:    Right Eye Near:   Left Eye Near:    Bilateral Near:     Physical Exam Vitals and nursing note reviewed.  Constitutional:      Appearance: Normal appearance. She is normal weight.  HENT:     Head: Normocephalic and atraumatic.     Right Ear: Tympanic membrane, ear canal and external ear normal.     Left Ear: Tympanic membrane, ear canal and external ear normal.     Mouth/Throat:     Mouth: Mucous membranes are moist.     Pharynx: Oropharynx is clear.  Eyes:     Extraocular Movements: Extraocular movements intact.     Conjunctiva/sclera: Conjunctivae normal.     Pupils: Pupils are equal, round, and reactive to light.  Cardiovascular:     Rate and Rhythm: Normal rate and regular rhythm.     Pulses: Normal pulses.     Heart sounds: Normal heart sounds.  Pulmonary:     Effort: Pulmonary effort is normal.     Breath sounds: Normal breath sounds. No wheezing, rhonchi or rales.  Musculoskeletal:        General: Normal range of motion.  Skin:    General: Skin is warm and dry.  Neurological:     General: No focal deficit present.     Mental Status: She is alert and oriented to person, place, and time. Mental status is at baseline.  Psychiatric:        Mood and Affect: Mood normal.        Behavior: Behavior normal.      UC Treatments / Results  Labs (all labs ordered are listed, but only abnormal results are displayed) Labs  Reviewed  POCT URINALYSIS DIP (MANUAL ENTRY) - Abnormal; Notable for the following components:      Result Value   Clarity, UA cloudy (*)    Blood, UA small (*)    Nitrite, UA Positive (*)     Leukocytes, UA Moderate (2+) (*)    All other components within normal limits  URINE CULTURE    EKG   Radiology No results found.  Procedures Procedures (including critical care time)  Medications Ordered in UC Medications - No data to display  Initial Impression / Assessment and Plan / UC Course  I have reviewed the triage vital signs and the nursing notes.  Pertinent labs & imaging results that were available during my care of the patient were reviewed by me and considered in my medical decision making (see chart for details).     MDM: 1.  Acute cystitis with hematuria-Rx'd cefdinir  300 mg capsule: Take 1 capsule twice daily x 7 days, UA revealed above, urine culture ordered. Advised patient take medication as directed with food to completion.  Encouraged to increase daily water intake to 64 ounces per day while taking this medication.  Advised we will follow-up with urine culture results once received.  Advised if symptoms worsen and/or unresolved please follow-up with your PCP or here for further evaluation.  Patient discharged home, hemodynamically stable Final Clinical Impressions(s) / UC Diagnoses   Final diagnoses:  Acute cystitis with hematuria     Discharge Instructions      Advised patient take medication as directed with food to completion.  Encouraged to increase daily water intake to 64 ounces per day while taking this medication.  Advised we will follow-up with urine culture results once received.  Advised if symptoms worsen and/or unresolved please follow-up with your PCP or here for further evaluation.     ED Prescriptions     Medication Sig Dispense Auth. Provider   cefdinir  (OMNICEF ) 300 MG capsule Take 1 capsule (300 mg total) by mouth 2 (two) times daily for 7 days. 14 capsule Shahara Hartsfield, FNP      PDMP not reviewed this encounter.   Teddy Sharper, FNP 10/31/23 1630

## 2023-11-01 ENCOUNTER — Telehealth: Payer: Self-pay

## 2023-11-01 NOTE — Telephone Encounter (Signed)
 Call made to pt to check on status since UC visit. States she's feeling a little better since UC visit. Advised to call back if any questions or concerns.

## 2023-11-03 LAB — URINE CULTURE: Culture: >=100000 — AB

## 2023-11-04 ENCOUNTER — Ambulatory Visit: Admitting: Family Medicine

## 2023-11-05 ENCOUNTER — Ambulatory Visit (HOSPITAL_COMMUNITY): Payer: Self-pay

## 2023-11-20 ENCOUNTER — Encounter: Payer: Self-pay | Admitting: Family Medicine

## 2023-11-20 ENCOUNTER — Ambulatory Visit (INDEPENDENT_AMBULATORY_CARE_PROVIDER_SITE_OTHER): Payer: Self-pay | Admitting: Family Medicine

## 2023-11-20 VITALS — BP 100/65 | HR 75 | Temp 98.8°F | Ht 65.0 in | Wt 131.0 lb

## 2023-11-20 DIAGNOSIS — M810 Age-related osteoporosis without current pathological fracture: Secondary | ICD-10-CM

## 2023-11-20 DIAGNOSIS — Z23 Encounter for immunization: Secondary | ICD-10-CM | POA: Diagnosis not present

## 2023-11-20 MED ORDER — RISEDRONATE SODIUM 35 MG PO TABS: 35.0000 mg | ORAL_TABLET | ORAL | 2 refills | Status: AC

## 2023-11-20 NOTE — Assessment & Plan Note (Signed)
 Cannot afford Prolia. Did not tolerate Fosamax. Wiling to try risedronate 35 mg weekly. Understands to take with glass of water and to avoid lying down for 30 minutes. She is up to date with bone density per previous provider. Follow-up in 3-6 months.

## 2023-11-20 NOTE — Progress Notes (Signed)
   Established Patient Office Visit  Subjective   Patient ID: Sonya Fields, female    DOB: 09-27-1955  Age: 68 y.o. MRN: [id number]  Chief Complaint  Patient presents with   Osteoporosis    Med management     HPI  Unable to afford Prolia Could not tolerate Fosamx due to GERD and drinking water after the pill. Willing to try risedronate 35 mg weekly.    ROS    Objective:     BP 100/65   Pulse 75   Temp 98.8 F (37.1 C) (Oral)   Ht 5' 5 (1.651 m)   Wt 131 lb (59.4 kg)   SpO2 96%   BMI 21.80 kg/m    Physical Exam Vitals and nursing note reviewed.  Constitutional:      General: She is not in acute distress.    Appearance: Normal appearance.  Cardiovascular:     Rate and Rhythm: Normal rate.  Pulmonary:     Effort: Pulmonary effort is normal.     Breath sounds: Normal breath sounds.  Skin:    General: Skin is warm and dry.  Neurological:     General: No focal deficit present.     Mental Status: She is alert. Mental status is at baseline.  Psychiatric:        Mood and Affect: Mood normal.        Behavior: Behavior normal.        Thought Content: Thought content normal.        Judgment: Judgment normal.      No results found for any visits on 11/20/23.    The 10-year ASCVD risk score (Arnett DK, et al., 2019) is: 3.5%    Assessment & Plan:   Problem List Items Addressed This Visit     Encounter for administration of vaccine   Relevant Orders   Flu vaccine HIGH DOSE PF(Fluzone Trivalent) (Completed)   Osteoporosis - Primary   Cannot afford Prolia. Did not tolerate Fosamax. Wiling to try risedronate 35 mg weekly. Understands to take with glass of water and to avoid lying down for 30 minutes. She is up to date with bone density per previous provider. Follow-up in 3-6 months.       Relevant Medications   risedronate (ACTONEL) 35 MG tablet  Agrees with plan of care discussed.  Questions answered.   Return in about 1 month (around  12/23/2023) for medicare wellness on phone .    Darice JONELLE Brownie, FNP

## 2023-12-26 ENCOUNTER — Encounter: Payer: Self-pay | Admitting: Family Medicine

## 2023-12-26 ENCOUNTER — Ambulatory Visit (INDEPENDENT_AMBULATORY_CARE_PROVIDER_SITE_OTHER): Admitting: Family Medicine

## 2023-12-26 VITALS — BP 120/82 | HR 65 | Temp 97.8°F | Ht 65.0 in | Wt 132.0 lb

## 2023-12-26 DIAGNOSIS — Z0000 Encounter for general adult medical examination without abnormal findings: Secondary | ICD-10-CM

## 2023-12-26 NOTE — Progress Notes (Signed)
 Subjective:   Sonya Fields is a 68 y.o. female who presents for a Medicare Annual Wellness Visit.  Allergies (verified) Cetirizine and Nitrofurantoin   History: Past Medical History:  Diagnosis Date   Allergic rhinitis    cause unspec   Anosmia    Anxiety    Arthritis    Atrial fibrillation (HCC)    CHF (congestive heart failure) (HCC)    COPD (chronic obstructive pulmonary disease) (HCC)    GERD (gastroesophageal reflux disease)    Heart murmur    HLD (hyperlipidemia)    Menopause 02/20/2007   Mitral stenosis    Osteoporosis    Past Surgical History:  Procedure Laterality Date   btl  02/19/1990   CARDIAC VALVE REPLACEMENT  06/05/2023   Cath (other)  02/20/2004   no CAD   dental implant  02/19/2006   MITRAL VALVULOPLASTY  01/19/2005   at [facility]   TUBAL LIGATION     Family History  Problem Relation Age of Onset   Diabetes Mother    Hyperlipidemia Mother    Hypertension Mother    Arthritis Mother    Stroke Father    Hypertension Father    Hyperlipidemia Father    Asthma Father    Vision loss Father    Arthritis Brother    Asthma Brother    Depression Brother    Hypertension Brother    Obesity Brother    Depression Brother    Hypertension Brother    Alcohol abuse Son    Drug abuse Son    Alcohol abuse Brother    Social History   Occupational History   Not on file  Tobacco Use   Smoking status: Former    Current packs/day: 0.00    Average packs/day: 0.5 packs/day for 14.0 years (7.0 ttl pk-yrs)    Types: Cigarettes    Start date: 02/19/1973    Quit date: 02/19/1986    Years since quitting: 37.8   Smokeless tobacco: Never   Tobacco comments:    I quit at least 35 years ago  Substance and Sexual Activity   Alcohol use: No   Drug use: Never   Sexual activity: Yes    Birth control/protection: Post-menopausal   Tobacco Counseling Counseling given: Not Answered Tobacco comments: I quit at least 35 years ago  SDOH Screenings   Food Insecurity:  No Food Insecurity (12/26/2023)  Housing: Low Risk  (12/26/2023)  Transportation Needs: No Transportation Needs (12/26/2023)  Utilities: Not At Risk (12/26/2023)  Alcohol Screen: Low Risk  (12/25/2023)  Depression (PHQ2-9): Low Risk  (12/26/2023)  Financial Resource Strain: Low Risk  (12/25/2023)  Physical Activity: Insufficiently Active (12/26/2023)  Social Connections: Socially Integrated (12/26/2023)  Stress: Stress Concern Present (12/26/2023)  Tobacco Use: Medium Risk (12/26/2023)  Health Literacy: Adequate Health Literacy (12/26/2023)   Depression Screen    12/26/2023    9:41 AM 12/26/2023    9:25 AM 11/20/2023    9:31 AM 06/12/2023    2:18 PM  PHQ 2/9 Scores  PHQ - 2 Score 1 1 2  0  PHQ- 9 Score 2 2 4 5      Goals Addressed             This Visit's Progress    Exercise 150 min/wk Moderate Activity         Visit info / Clinical Intake: Medicare Wellness Visit Type:: Initial Annual Wellness Visit Medicare Wellness Visit Mode:: In-person (required for WTM) Interpreter Needed?: No Pre-visit prep was completed: yes AWV  questionnaire completed by patient prior to visit?: yes Living arrangements:: lives with spouse/significant other Patient's Overall Health Status Rating: good Typical amount of pain: none Does pain affect daily life?: no Are you currently prescribed opioids?: no  Dietary Habits and Nutritional Risks How many meals a day?: 3 Eats fruit and vegetables daily?: yes Most meals are obtained by: preparing own meals; eating out Diabetic:: no  Functional Status Activities of Daily Living (to include ambulation/medication): Independent Ambulation: Independent Medication Administration: Independent Home Management: Independent Manage your own finances?: yes Primary transportation is: driving Concerns about vision?: no *vision screening is required for WTM* Concerns about hearing?: no  Fall Screening Falls in the past year?: 0 Number of falls in past year: 0 Was  there an injury with Fall?: 0 Fall Risk Category Calculator: 0 Patient Fall Risk Level: Low Fall Risk  Fall Risk Patient at Risk for Falls Due to: No Fall Risks Fall risk Follow up: Falls evaluation completed  Home and Transportation Safety: All rugs have non-skid backing?: yes All stairs or steps have railings?: yes Grab bars in the bathtub or shower?: (!) no Have non-skid surface in bathtub or shower?: yes Good home lighting?: yes Regular seat belt use?: yes Hospital stays in the last year:: (!) yes How many hospital stays:: 3 Reason: valve replacement, a-fib  Cognitive Assessment Difficulty concentrating, remembering, or making decisions? : no Will 6CIT or Mini Cog be Completed: yes What year is it?: 0 points What month is it?: 0 points Give patient an address phrase to remember (5 components): [address] [place] About what time is it?: 0 points Count backwards from 20 to 1: 0 points Say the months of the year in reverse: 0 points Repeat the address phrase from earlier: 0 points 6 CIT Score: 0 points        Objective:    Today's Vitals   12/26/23 0923  BP: 120/82  Pulse: 65  Temp: 97.8 F (36.6 C)  TempSrc: Oral  SpO2: 96%  Weight: 132 lb (59.9 kg)  Height: 5' 5 (1.651 m)  PainSc: 0-No pain   Body mass index is 21.97 kg/m.  Current Medications (verified) Outpatient Encounter Medications as of 12/26/2023  Medication Sig   amiodarone (PACERONE) 200 MG tablet Take 1 tablet by mouth daily.   apixaban (ELIQUIS) 5 MG TABS tablet Take 5 mg by mouth 2 (two) times daily.   levalbuterol (XOPENEX HFA) 45 MCG/ACT inhaler Inhale into the lungs.   Magnesium Citrate 100 MG CAPS Take 1 capsule by mouth.   metoprolol tartrate (LOPRESSOR) 25 MG tablet Take 37.5 mg by mouth.   omeprazole (PRILOSEC OTC) 20 MG tablet Take 20 mg by mouth daily.   risedronate (ACTONEL) 35 MG tablet Take 1 tablet (35 mg total) by mouth every 7 (seven) days. with water on empty  stomach, nothing by mouth or lie down for next 30 minutes.   [DISCONTINUED] aspirin 81 MG chewable tablet Chew 81 mg by mouth once.   [DISCONTINUED] warfarin (COUMADIN ) 5 MG tablet Indications: treatment to prevent blood clots in chronic atrial fibrillation. TAKE 1 TABLET BY MOUTH ONCE DAILY, EXCEPT  1/2  TABLET  BY  MOUTH  ON  MONDAY WEDNESDAYS and FRIDAYS  OR  AS  DIRECTED   No facility-administered encounter medications on file as of 12/26/2023.   Hearing/Vision screen Hearing Screening - Comments:: Recent hearing test normal Vision Screening - Comments:: Wears glasses. No vision issues.  Immunizations and Health Maintenance Health Maintenance  Topic  Date Due   Hepatitis C Screening  Never done   Pneumococcal Vaccine: 50+ Years (2 of 2 - PCV) 01/19/2018   DTaP/Tdap/Td (2 - Tdap) 12/07/2018   COVID-19 Vaccine (5 - 2025-26 season) 10/21/2023   Medicare Annual Wellness (AWV)  12/25/2024   Mammogram  03/27/2025   Fecal DNA (Cologuard)  07/09/2026   Influenza Vaccine  Completed   DEXA SCAN  Completed   Zoster Vaccines- Shingrix  Completed   Meningococcal B Vaccine  Aged Out        Assessment/Plan:  This is a routine wellness examination for Sonya Fields.  Patient Care Team: Booker Darice SAUNDERS, FNP as PCP - General (Family Medicine)  I have personally reviewed and noted the following in the patient's chart:   Medical and social history Use of alcohol, tobacco or illicit drugs  Current medications and supplements including opioid prescriptions. Functional ability and status Nutritional status Physical activity Advanced directives List of other physicians Hospitalizations, surgeries, and ER visits in previous 12 months: 3 times for a-fib after valve replacment.  Vitals Screenings to include cognitive, depression, and falls Referrals and appointments  No orders of the defined types were placed in this encounter.  In addition, I have reviewed and discussed with patient certain  preventive protocols, quality metrics, and best practice recommendations. A written personalized care plan for preventive services as well as general preventive health recommendations were provided to patient.   Darice SAUNDERS Booker, FNP   12/26/2023   No follow-ups on file.  After Visit Summary: (In Person-Printed) AVS printed and given to the patient  Nurse Notes: Up to date with flu shot.  Will get other vaccines at local pharmacy

## 2023-12-26 NOTE — Patient Instructions (Addendum)
 [name],  Thank you for taking the time for your Medicare Wellness Visit. I appreciate your continued commitment to your health goals. Please review the care plan we discussed, and feel free to reach out if I can assist you further.  Please note that Annual Wellness Visits do not include a physical exam. Some assessments may be limited, especially if the visit was conducted virtually. If needed, we may recommend an in-person follow-up with your provider.  Ongoing Care Seeing your primary care provider every 3 to 6 months helps us  monitor your health and provide consistent, personalized care.   Referrals If a referral was made during today's visit and you haven't received any updates within two weeks, please contact the referred provider directly to check on the status.  Recommended Screenings:  Health Maintenance  Topic Date Due   Hepatitis C Screening  Never done   Pneumococcal Vaccine for age over 44 (2 of 2 - PCV) 01/19/2018   DTaP/Tdap/Td vaccine (2 - Tdap) 12/07/2018   COVID-19 Vaccine (5 - 2025-26 season) 10/21/2023   Medicare Annual Wellness Visit  12/25/2024   Breast Cancer Screening  03/27/2025   Cologuard (Stool DNA test)  07/09/2026   Flu Shot  Completed   DEXA scan (bone density measurement)  Completed   Zoster (Shingles) Vaccine  Completed   Meningitis B Vaccine  Aged Out        No data to display          Vision: Annual vision screenings are recommended for early detection of glaucoma, cataracts, and diabetic retinopathy. These exams can also reveal signs of chronic conditions such as diabetes and high blood pressure.  Dental: Annual dental screenings help detect early signs of oral cancer, gum disease, and other conditions linked to overall health, including heart disease and diabetes.  Please see the attached documents for additional preventive care recommendations.       No data to display

## 2024-01-09 ENCOUNTER — Ambulatory Visit (INDEPENDENT_AMBULATORY_CARE_PROVIDER_SITE_OTHER): Admitting: Family Medicine

## 2024-01-09 ENCOUNTER — Ambulatory Visit: Payer: Self-pay

## 2024-01-09 ENCOUNTER — Encounter: Payer: Self-pay | Admitting: Family Medicine

## 2024-01-09 VITALS — BP 133/79 | HR 76 | Ht 65.0 in | Wt 135.1 lb

## 2024-01-09 DIAGNOSIS — J019 Acute sinusitis, unspecified: Secondary | ICD-10-CM | POA: Diagnosis not present

## 2024-01-09 MED ORDER — AMOXICILLIN-POT CLAVULANATE 875-125 MG PO TABS: 1.0000 | ORAL_TABLET | Freq: Two times a day (BID) | ORAL | 0 refills | Status: DC

## 2024-01-09 NOTE — Telephone Encounter (Signed)
 Line disconnected while attempting to schedule an appointment for the patient. Attempted to call her back but there was no answer. LVM to have patient call back. Routing for additional attempts.       Copied from CRM [id number]. Topic: Clinical - Red Word Triage >> Jan 09, 2024 12:43 PM Deaijah H wrote: Red Word that prompted transfer to Nurse Triage: Wet cough w/ mucus that's increased within the last week or so.        Reason for Disposition  Cough has been present for > 3 weeks  Answer Assessment - Initial Assessment Questions 1. ONSET: When did the cough begin?      10 weeks  2. SEVERITY: How bad is the cough today?      Mild tom oderate  3. SPUTUM: Describe the color of your sputum (e.g., none, dry cough; clear, white, yellow, green)     White  4. HEMOPTYSIS: Are you coughing up any blood? If Yes, ask: How much? (e.g., flecks, streaks, tablespoons, etc.)     No 5. DIFFICULTY BREATHING: Are you having difficulty breathing? If Yes, ask: How bad is it? (e.g., mild, moderate, severe)      No 6. FEVER: Do you have a fever? If Yes, ask: What is your temperature, how was it measured, and when did it start?     No 7. CARDIAC HISTORY: Do you have any history of heart disease? (e.g., heart attack, congestive heart failure)      Yes  8. LUNG HISTORY: Do you have any history of lung disease?  (e.g., pulmonary embolus, asthma, emphysema)     Yes 9. PE RISK FACTORS: Do you have a history of blood clots? (or: recent major surgery, recent prolonged travel, bedridden)     No 10. OTHER SYMPTOMS: Do you have any other symptoms? (e.g., runny nose, wheezing, chest pain)       Sinus headache  Protocols used: Cough - Acute Productive-A-AH

## 2024-01-09 NOTE — Progress Notes (Signed)
 Acute Office Visit  Patient ID: Sonya Fields, female    DOB: Nov 09, 1955, 68 y.o.   MRN: [id number]  PCP: Booker Darice SAUNDERS, FNP  No chief complaint on file.   Subjective:     HPI  Discussed the use of AI scribe software for clinical note transcription with the patient, who gave verbal consent to proceed.  History of Present Illness Sonya Fields is a 68 year old female who presents with sinus congestion and headaches.  Sinus congestion and facial pain - Significant sinus congestion with pain localized primarily in the cheeks - Severe headaches, more pronounced on the left side - Left ear feels stuffy - No throat pain, but irritation and some drainage present in the throat  Cough and constitutional symptoms - Persistent upper cough - No fever, chills, or sweats  Response to over-the-counter treatments - No improvement with Mucinex, cold medicine, honey, saline spray, other nasal sprays, or tea  Temporal association with prior procedures - Symptoms may have started after undergoing two transesophageal echocardiograms in the past  Recent antibiotic use - No recent antibiotic use - History of urinary tract infection treated with nitrofurantoin   ROS     Objective:    BP 133/79   Pulse 76   Ht 5' 5 (1.651 m)   Wt 135 lb 1.9 oz (61.3 kg)   SpO2 98%   BMI 22.49 kg/m    Physical Exam Constitutional:      Appearance: Normal appearance.  HENT:     Head: Normocephalic and atraumatic.     Right Ear: Tympanic membrane, ear canal and external ear normal. There is no impacted cerumen.     Left Ear: Tympanic membrane, ear canal and external ear normal. There is no impacted cerumen.     Nose: Nose normal.     Mouth/Throat:     Pharynx: Oropharynx is clear.  Eyes:     Conjunctiva/sclera: Conjunctivae normal.  Cardiovascular:     Rate and Rhythm: Normal rate and regular rhythm.  Pulmonary:     Effort: Pulmonary effort is normal.     Breath sounds: Normal  breath sounds.  Musculoskeletal:     Cervical back: Neck supple. No tenderness.  Lymphadenopathy:     Cervical: No cervical adenopathy.  Skin:    General: Skin is warm and dry.  Neurological:     Mental Status: She is alert and oriented to person, place, and time.  Psychiatric:        Mood and Affect: Mood normal.       No results found for any visits on 01/09/24.     Assessment & Plan:   Problem List Items Addressed This Visit   None Visit Diagnoses       Acute non-recurrent sinusitis, unspecified location    -  Primary   Relevant Medications   amoxicillin-clavulanate (AUGMENTIN) 875-125 MG tablet       Assessment and Plan Assessment & Plan Acute left-sided sinusitis with headache and congestion Acute sinusitis with headache and congestion, primarily affecting left cheek and ear. Previous treatments.  - Prescribed Augmentin twice daily for 10 days. Discontinue after 7 days if significant improvement, otherwise complete 10 days. - Continue saline nasal spray and salt water rinses.  Cough associated with upper respiratory symptoms Cough with upper respiratory symptoms. Previous treatments. - Continue current supportive care measures.    Meds ordered this encounter  Medications   amoxicillin-clavulanate (AUGMENTIN) 875-125 MG tablet    Sig: Take 1 tablet  by mouth 2 (two) times daily.    Dispense:  20 tablet    Refill:  0    Return if symptoms worsen or fail to improve.  Dorothyann Byars, MD [facility] at [facility]

## 2024-01-09 NOTE — Telephone Encounter (Signed)
 FYI Only or Action Required?: FYI only for provider: appointment scheduled on 01/09/24.  Patient was last seen in primary care on 12/26/2023 by Booker Darice SAUNDERS, FNP.  Called Nurse Triage reporting Cough.  Symptoms began several months ago.  Interventions attempted: Other: unknown.  Symptoms are: productive cough, sinus headache.  Triage Disposition: See PCP When Office is Open (Within 3 Days)  Patient/caregiver understands and will follow disposition?: Yes               Called patient back and scheduled for acute visit at [facility] this afternoon with Dr Alvan. Home care advice provided and patient verbalizes understanding.

## 2024-01-09 NOTE — Progress Notes (Signed)
 Pt reports that she has been feeling like this for 10 weeks.  She stated that she has had 2 Transesophageal echos and thinks that maybe this has caused her issues.  She has been taking several OTC cough meds and was given tessalon  also. Denies any f/s/c

## 2024-01-27 ENCOUNTER — Encounter: Payer: Self-pay | Admitting: Family Medicine

## 2024-01-27 ENCOUNTER — Ambulatory Visit: Admitting: Family Medicine

## 2024-01-27 VITALS — BP 128/73 | HR 71 | Temp 98.0°F | Ht 64.0 in | Wt 132.7 lb

## 2024-01-27 DIAGNOSIS — R053 Chronic cough: Secondary | ICD-10-CM | POA: Insufficient documentation

## 2024-01-27 DIAGNOSIS — J449 Chronic obstructive pulmonary disease, unspecified: Secondary | ICD-10-CM | POA: Diagnosis not present

## 2024-01-27 DIAGNOSIS — Z9889 Other specified postprocedural states: Secondary | ICD-10-CM | POA: Diagnosis not present

## 2024-01-27 MED ORDER — BUDESONIDE-FORMOTEROL FUMARATE 80-4.5 MCG/ACT IN AERO: 2.0000 | INHALATION_SPRAY | Freq: Two times a day (BID) | RESPIRATORY_TRACT | 3 refills | Status: AC

## 2024-01-27 NOTE — Assessment & Plan Note (Addendum)
 Has seen pulmonary for chronic cough. Chest x-ray and PFTs normal. Has been told she has mild COPD.  Does not have maintenance inhaler. Will try Symbicort  80-4.5 mcg inhaler BID to see if symptoms improve.  Lungs are clear in office today. Oxygen saturation 98%.

## 2024-01-27 NOTE — Progress Notes (Signed)
 Acute Office Visit  Subjective:     Patient ID: Sonya Fields, female    DOB: April 19, 1955, 68 y.o.   MRN: [id number]  Chief Complaint  Patient presents with   Cough    HPI Patient is in today for cough since September. Has had cough in the past, takes reflux medicine is not helping. Took Augmentin  for sinus infection on 01/09/24. It is a never ending cycle. Has tried cough suppressant and antihistamine. Seen pulmonary Dec 17, 2023: was told to see PCP. Chest x-ray normal. PFT normal.  Has rescue inhaler, does not help. Has COPD.  Needs maintenance inhaler.  Has had TEE x2  before this started.  Symptoms worse after second TEE Will send referral to ENT for evaluation.     ROS      Objective:    BP 128/73 (BP Location: Left Arm, Patient Position: Sitting, Cuff Size: Normal)   Pulse 71   Temp 98 F (36.7 C) (Oral)   Ht 5' 4 (1.626 m)   Wt 132 lb 11.2 oz (60.2 kg)   SpO2 98%   BMI 22.78 kg/m    Physical Exam Vitals and nursing note reviewed.  Constitutional:      General: She is not in acute distress.    Appearance: Normal appearance.  Cardiovascular:     Rate and Rhythm: Normal rate and regular rhythm.     Heart sounds: Normal heart sounds.  Pulmonary:     Effort: Pulmonary effort is normal.     Breath sounds: Normal breath sounds.  Skin:    General: Skin is warm and dry.  Neurological:     General: No focal deficit present.     Mental Status: She is alert. Mental status is at baseline.  Psychiatric:        Mood and Affect: Mood normal.        Behavior: Behavior normal.        Thought Content: Thought content normal.        Judgment: Judgment normal.     No results found for any visits on 01/27/24.      Assessment & Plan:   Problem List Items Addressed This Visit     Chronic cough - Primary   Relevant Medications   budesonide -formoterol  (SYMBICORT ) 80-4.5 MCG/ACT inhaler   Other Relevant Orders   Ambulatory referral to ENT    Chronic obstructive pulmonary disease (HCC)   Has seen pulmonary for chronic cough. Chest x-ray and PFTs normal. Has been told she has mild COPD.  Does not have maintenance inhaler. Will try Symbicort  80-4.5 mcg inhaler BID to see if symptoms improve.  Lungs are clear in office today. Oxygen saturation 98%.       Relevant Medications   budesonide -formoterol  (SYMBICORT ) 80-4.5 MCG/ACT inhaler   Status post transesophageal echocardiogram (TEE)   Symptoms worse since TEE x 2 in September. Referral placed to ENT for evaluation of throat as a cause of her cough.        Relevant Orders   Ambulatory referral to ENT    Meds ordered this encounter  Medications   budesonide -formoterol  (SYMBICORT ) 80-4.5 MCG/ACT inhaler    Sig: Inhale 2 puffs into the lungs 2 (two) times daily.    Dispense:  1 each    Refill:  3    Supervising Provider:   METHENEY, CATHERINE D [[id number]]  Agrees with plan of care discussed.  Questions answered.   Return if symptoms worsen or fail to improve.  Darice SAUNDERS  Booker, FNP

## 2024-01-27 NOTE — Assessment & Plan Note (Addendum)
 Symptoms worse since TEE x 2 in September. Referral placed to ENT for evaluation of throat as a cause of her cough.
